# Patient Record
Sex: Male | Born: 1955 | Race: Black or African American | Hispanic: No | Marital: Married | Smoking: Current every day smoker
Health system: Southern US, Community
[De-identification: ages and names within clinical notes are randomized; demographics above are authoritative.]

## PROBLEM LIST (undated history)

## (undated) DIAGNOSIS — I10 Essential (primary) hypertension: Secondary | ICD-10-CM

## (undated) DIAGNOSIS — E119 Type 2 diabetes mellitus without complications: Secondary | ICD-10-CM

## (undated) DIAGNOSIS — Z960 Presence of urogenital implants: Secondary | ICD-10-CM

## (undated) DIAGNOSIS — G629 Polyneuropathy, unspecified: Secondary | ICD-10-CM

## (undated) DIAGNOSIS — I639 Cerebral infarction, unspecified: Secondary | ICD-10-CM

## (undated) DIAGNOSIS — Z978 Presence of other specified devices: Secondary | ICD-10-CM

## (undated) DIAGNOSIS — N400 Enlarged prostate without lower urinary tract symptoms: Secondary | ICD-10-CM

## (undated) DIAGNOSIS — E785 Hyperlipidemia, unspecified: Secondary | ICD-10-CM

## (undated) DIAGNOSIS — H409 Unspecified glaucoma: Secondary | ICD-10-CM

## (undated) HISTORY — PX: EYE SURGERY: SHX253

## (undated) HISTORY — PX: APPENDECTOMY: SHX54

---

## 2014-01-25 DIAGNOSIS — I639 Cerebral infarction, unspecified: Secondary | ICD-10-CM

## 2014-01-25 HISTORY — DX: Cerebral infarction, unspecified: I63.9

## 2016-12-02 ENCOUNTER — Encounter (HOSPITAL_COMMUNITY): Payer: Self-pay

## 2016-12-02 ENCOUNTER — Emergency Department (HOSPITAL_COMMUNITY)
Admission: EM | Admit: 2016-12-02 | Discharge: 2016-12-03 | Disposition: A | Discharge status: Home / Self Care | Payer: Medicare Other | Attending: Emergency Medicine | Admitting: Emergency Medicine

## 2016-12-02 DIAGNOSIS — Z7984 Long term (current) use of oral hypoglycemic drugs: Secondary | ICD-10-CM | POA: Diagnosis not present

## 2016-12-02 DIAGNOSIS — F1019 Alcohol abuse with unspecified alcohol-induced disorder: Secondary | ICD-10-CM | POA: Diagnosis not present

## 2016-12-02 DIAGNOSIS — Z7982 Long term (current) use of aspirin: Secondary | ICD-10-CM | POA: Diagnosis not present

## 2016-12-02 DIAGNOSIS — E11649 Type 2 diabetes mellitus with hypoglycemia without coma: Secondary | ICD-10-CM

## 2016-12-02 DIAGNOSIS — Z79899 Other long term (current) drug therapy: Secondary | ICD-10-CM | POA: Insufficient documentation

## 2016-12-02 DIAGNOSIS — R440 Auditory hallucinations: Secondary | ICD-10-CM | POA: Diagnosis not present

## 2016-12-02 DIAGNOSIS — F17210 Nicotine dependence, cigarettes, uncomplicated: Secondary | ICD-10-CM | POA: Insufficient documentation

## 2016-12-02 DIAGNOSIS — E1165 Type 2 diabetes mellitus with hyperglycemia: Secondary | ICD-10-CM | POA: Insufficient documentation

## 2016-12-02 DIAGNOSIS — R45851 Suicidal ideations: Secondary | ICD-10-CM | POA: Insufficient documentation

## 2016-12-02 DIAGNOSIS — I10 Essential (primary) hypertension: Secondary | ICD-10-CM | POA: Insufficient documentation

## 2016-12-02 DIAGNOSIS — F329 Major depressive disorder, single episode, unspecified: Secondary | ICD-10-CM | POA: Insufficient documentation

## 2016-12-02 DIAGNOSIS — Z720 Tobacco use: Secondary | ICD-10-CM

## 2016-12-02 DIAGNOSIS — Z008 Encounter for other general examination: Secondary | ICD-10-CM

## 2016-12-02 DIAGNOSIS — F1414 Cocaine abuse with cocaine-induced mood disorder: Secondary | ICD-10-CM | POA: Diagnosis present

## 2016-12-02 DIAGNOSIS — F1410 Cocaine abuse, uncomplicated: Secondary | ICD-10-CM

## 2016-12-02 DIAGNOSIS — F1010 Alcohol abuse, uncomplicated: Secondary | ICD-10-CM | POA: Diagnosis not present

## 2016-12-02 DIAGNOSIS — F1419 Cocaine abuse with unspecified cocaine-induced disorder: Secondary | ICD-10-CM | POA: Diagnosis not present

## 2016-12-02 HISTORY — DX: Unspecified glaucoma: H40.9

## 2016-12-02 HISTORY — DX: Essential (primary) hypertension: I10

## 2016-12-02 HISTORY — DX: Benign prostatic hyperplasia without lower urinary tract symptoms: N40.0

## 2016-12-02 HISTORY — DX: Type 2 diabetes mellitus without complications: E11.9

## 2016-12-02 HISTORY — DX: Hyperlipidemia, unspecified: E78.5

## 2016-12-02 HISTORY — DX: Cerebral infarction, unspecified: I63.9

## 2016-12-02 HISTORY — DX: Polyneuropathy, unspecified: G62.9

## 2016-12-02 LAB — CBG MONITORING, ED
Glucose-Capillary: 47 mg/dL — ABNORMAL LOW (ref 65–99)
Glucose-Capillary: 146 mg/dL — ABNORMAL HIGH (ref 65–99)
Glucose-Capillary: 108 mg/dL — ABNORMAL HIGH (ref 65–99)

## 2016-12-02 LAB — RAPID URINE DRUG SCREEN, HOSP PERFORMED
AMPHETAMINES: NOT DETECTED
Barbiturates: NOT DETECTED
Benzodiazepines: NOT DETECTED
COCAINE: POSITIVE — AB
OPIATES: NOT DETECTED
TETRAHYDROCANNABINOL: NOT DETECTED

## 2016-12-02 LAB — COMPREHENSIVE METABOLIC PANEL
ALT: 17 U/L (ref 17–63)
ANION GAP: 12 (ref 5–15)
AST: 16 U/L (ref 15–41)
Albumin: 4 g/dL (ref 3.5–5.0)
Alkaline Phosphatase: 59 U/L (ref 38–126)
BUN: 9 mg/dL (ref 6–20)
CHLORIDE: 102 mmol/L (ref 101–111)
CO2: 23 mmol/L (ref 22–32)
Calcium: 9.1 mg/dL (ref 8.9–10.3)
Creatinine, Ser: 1.29 mg/dL — ABNORMAL HIGH (ref 0.61–1.24)
GFR calc non Af Amer: 57 mL/min — ABNORMAL LOW (ref >60)
Glucose, Bld: 59 mg/dL — ABNORMAL LOW (ref 65–99)
POTASSIUM: 3.6 mmol/L (ref 3.5–5.1)
SODIUM: 137 mmol/L (ref 135–145)
Total Bilirubin: 0.6 mg/dL (ref 0.3–1.2)
Total Protein: 7.8 g/dL (ref 6.5–8.1)

## 2016-12-02 LAB — CBC
HCT: 43.1 % (ref 39.0–52.0)
HEMOGLOBIN: 14.6 g/dL (ref 13.0–17.0)
MCH: 30.2 pg (ref 26.0–34.0)
MCHC: 33.9 g/dL (ref 30.0–36.0)
MCV: 89.2 fL (ref 78.0–100.0)
Platelets: 371 10*3/uL (ref 150–400)
RBC: 4.83 MIL/uL (ref 4.22–5.81)
RDW: 14.6 % (ref 11.5–15.5)
WBC: 10.7 10*3/uL — AB (ref 4.0–10.5)

## 2016-12-02 LAB — SALICYLATE LEVEL

## 2016-12-02 LAB — ETHANOL: Alcohol, Ethyl (B): 34 mg/dL — ABNORMAL HIGH (ref <10)

## 2016-12-02 LAB — ACETAMINOPHEN LEVEL

## 2016-12-02 MED ORDER — VITAMIN B-1 100 MG PO TABS
100.0000 mg | ORAL_TABLET | Freq: Every day | ORAL | Status: DC
Start: 2016-12-02 — End: 2016-12-03
  Administered 2016-12-02 – 2016-12-03 (×2): 100 mg via ORAL
  Filled 2016-12-02 (×2): qty 1

## 2016-12-02 MED ORDER — LORAZEPAM 1 MG PO TABS
0.0000 mg | ORAL_TABLET | Freq: Four times a day (QID) | ORAL | Status: DC
  Administered 2016-12-03: 1 mg via ORAL
  Filled 2016-12-02: qty 1

## 2016-12-02 MED ORDER — ALUM & MAG HYDROXIDE-SIMETH 200-200-20 MG/5ML PO SUSP: 30.0000 mL | Freq: Four times a day (QID) | ORAL | Status: DC | PRN

## 2016-12-02 MED ORDER — ASPIRIN EC 81 MG PO TBEC
81.0000 mg | DELAYED_RELEASE_TABLET | Freq: Every day | ORAL | Status: DC
  Administered 2016-12-02 – 2016-12-03 (×2): 81 mg via ORAL
  Filled 2016-12-02 (×2): qty 1

## 2016-12-02 MED ORDER — LORAZEPAM 1 MG PO TABS: 0.0000 mg | ORAL_TABLET | Freq: Two times a day (BID) | ORAL | Status: DC

## 2016-12-02 MED ORDER — SIMVASTATIN 40 MG PO TABS
40.0000 mg | ORAL_TABLET | Freq: Every day | ORAL | Status: DC
  Administered 2016-12-02: 40 mg via ORAL
  Filled 2016-12-02: qty 1

## 2016-12-02 MED ORDER — ZOLPIDEM TARTRATE 5 MG PO TABS
5.0000 mg | ORAL_TABLET | Freq: Every evening | ORAL | Status: DC | PRN
Start: 2016-12-02 — End: 2016-12-03
  Administered 2016-12-02: 5 mg via ORAL
  Filled 2016-12-02: qty 1

## 2016-12-02 MED ORDER — LORAZEPAM 2 MG/ML IJ SOLN: 0.0000 mg | Freq: Four times a day (QID) | INTRAMUSCULAR | Status: DC

## 2016-12-02 MED ORDER — BISOPROLOL-HYDROCHLOROTHIAZIDE 5-6.25 MG PO TABS
1.0000 | ORAL_TABLET | Freq: Every day | ORAL | Status: DC
  Administered 2016-12-03: 1 via ORAL
  Filled 2016-12-02: qty 1

## 2016-12-02 MED ORDER — ACETAMINOPHEN 325 MG PO TABS: 650.0000 mg | ORAL_TABLET | ORAL | Status: DC | PRN

## 2016-12-02 MED ORDER — TAMSULOSIN HCL 0.4 MG PO CAPS
0.4000 mg | ORAL_CAPSULE | Freq: Two times a day (BID) | ORAL | Status: DC
  Administered 2016-12-02 – 2016-12-03 (×2): 0.4 mg via ORAL
  Filled 2016-12-02 (×2): qty 1

## 2016-12-02 MED ORDER — GABAPENTIN 300 MG PO CAPS
300.0000 mg | ORAL_CAPSULE | Freq: Two times a day (BID) | ORAL | Status: DC
  Administered 2016-12-02 – 2016-12-03 (×2): 300 mg via ORAL
  Filled 2016-12-02 (×2): qty 1

## 2016-12-02 MED ORDER — GLIMEPIRIDE 4 MG PO TABS
4.0000 mg | ORAL_TABLET | Freq: Every day | ORAL | Status: DC
  Administered 2016-12-03: 4 mg via ORAL
  Filled 2016-12-02: qty 1

## 2016-12-02 MED ORDER — TRAVOPROST 0.004 % OP SOLN: 1.0000 [drp] | Freq: Every day | OPHTHALMIC | Status: DC

## 2016-12-02 MED ORDER — LATANOPROST 0.005 % OP SOLN
1.0000 [drp] | Freq: Every day | OPHTHALMIC | Status: DC
  Administered 2016-12-02: 1 [drp] via OPHTHALMIC
  Filled 2016-12-02: qty 2.5

## 2016-12-02 MED ORDER — LORAZEPAM 2 MG/ML IJ SOLN: 0.0000 mg | Freq: Two times a day (BID) | INTRAMUSCULAR | Status: DC

## 2016-12-02 MED ORDER — THIAMINE HCL 100 MG/ML IJ SOLN: 100.0000 mg | Freq: Every day | INTRAMUSCULAR | Status: DC

## 2016-12-02 MED ORDER — ONDANSETRON HCL 4 MG PO TABS: 4.0000 mg | ORAL_TABLET | Freq: Three times a day (TID) | ORAL | Status: DC | PRN

## 2016-12-02 MED ORDER — NICOTINE 21 MG/24HR TD PT24
21.0000 mg | MEDICATED_PATCH | Freq: Every day | TRANSDERMAL | Status: DC
  Administered 2016-12-02 – 2016-12-03 (×2): 21 mg via TRANSDERMAL
  Filled 2016-12-02 (×2): qty 1

## 2016-12-02 NOTE — ED Notes (Signed)
Pt oriented to room and unit.  Pt is pleasant and cooperative.  Pt denies S/I,H/I, and AVH.  All belongings were locked in hall closet.  Two suitcases and one belongings bag.  15 minute checks and video monitoring in place.

## 2016-12-02 NOTE — ED Notes (Signed)
Bed: [facility] Expected date:  Expected time:  Means of arrival:  Comments: 

## 2016-12-02 NOTE — ED Triage Notes (Signed)
Patient brought in by EMS from hotel with complaints of depression for 6 months and cocaine dependency for 20 years. Requesting detox. Hx of htn and diabetes.

## 2016-12-02 NOTE — Progress Notes (Signed)
TTS spoke with Angelica ChessmanMandy, RN who states the pt is currently being assessed by peer support specialists in the conference room. [facility] once peer support has completed their assessment and TTS assessment will be completed once pt is in [facility].  Princess BruinsAquicha Tamura Lasky, MSW, LCSW Therapeutic Triage Specialist  [phone]

## 2016-12-02 NOTE — Patient Outreach (Signed)
ED Peer Support Specialist Patient Intake (Complete at intake & 30-60 Day Follow-up)  Name: Paul QuanWill Chase  MRN: [id number]  Age: 61 y.o.   Date of Admission: 12/02/2016  Intake: Initial Comments:      Primary Reason Admitted: Patient states "this morning, I woke up and I just feel like I have no purpose. Why am I even alive?" Patient reports having suicidal ideation without plan this morning, but denies homicidal ideation. Patient reports feelings of anxiety, depression, and frustration. Patient states "if I can just detox from the dope, I think I can get my life back together    Lab values: Alcohol/ETOH: Negative Positive UDS?   Amphetamines: No Barbiturates: No Benzodiazepines: No Cocaine: Yes Opiates: No Cannabinoids: Yes  Demographic information: Gender: Male Ethnicity: African American Marital Status: Married Insurance Status: Medicaid(united Care ) Control and instrumentation engineereceives non-medical governmental assistance (Work Engineer, agriculturalirst/Welfare, Sales executivefood stamps, etc.: No Lives with: Partner/Spouse Living situation: House/Apartment  Reported Patient History: Patient reported health conditions: Depression, Diabetes Patient aware of HIV and hepatitis status: No  In past year, has patient visited ED for any reason? No  Number of ED visits:    Reason(s) for visit:    In past year, has patient been hospitalized for any reason? No  Number of hospitalizations:    Reason(s) for hospitalization:    In past year, has patient been arrested? No  Number of arrests:    Reason(s) for arrest:    In past year, has patient been incarcerated?    Number of incarcerations:    Reason(s) for incarceration:    In past year, has patient received medication-assisted treatment? No  In past year, patient received the following treatments:    In past year, has patient received any harm reduction services? No  Did this include any of the following?    In past year, has patient received care from a mental health provider  for diagnosis other than SUD? No  In past year, is this first time patient has overdosed? No  Number of past overdoses:    In past year, is this first time patient has been hospitalized for an overdose? No  Number of hospitalizations for overdose(s):    Is patient currently receiving treatment for a mental health diagnosis? No  Patient reports experiencing difficulty participating in SUD treatment: No    Most important reason(s) for this difficulty?    Has patient received prior services for treatment? No  In past, patient has received services from following agencies:    Plan of Care:  Suggested follow up at these agencies/treatment centers: [facility] (Substance Abuse Intensive Outpatient Program), [facility] (Alcohol/Drugs Services)  Other information: CPSS Arlys JohnBrian and [name]  asked Pt several questions and was made aware that he does not want use any longer. CPSS addressed a few facilities that Paul help him. CPSS made Pt aware of [facility].   Arlys JohnBrian [name], CPSS  12/02/2016 5:31 PM

## 2016-12-02 NOTE — BH Assessment (Signed)
[facility] Assessment Progress Note   Pt is still in hallway. TTS spoke with Angelica ChessmanMandy, RN who states [facility] once [facility] has given his medication. Chart states pt is in [facility] however [facility]  Princess BruinsAquicha Jacquel Mccamish, MSW, LCSW Therapeutic Triage Specialist  [phone]

## 2016-12-02 NOTE — ED Notes (Signed)
Patient provided with orange juice  

## 2016-12-02 NOTE — ED Notes (Signed)
Patient provided with Malawiturkey sandwich and water, per order

## 2016-12-02 NOTE — ED Triage Notes (Signed)
Patient reports he has been "smoking dope" for 20 years. Patient [place] "to stop taking the drugs" but patient reports "the motel Im staying at has strangers that are offering me cocaine and I just cant refuse." Patient states "this morning, I woke up and I just feel like I have no purpose. Why am I even alive?" Patient reports having suicidal ideation without plan this morning, but denies homicidal ideation. Patient reports feelings of anxiety, depression, and frustration. Patient states "if I can just detox from the dope, I think I can get my life back together."

## 2016-12-02 NOTE — BH Assessment (Addendum)
Assessment Note  Paul Chase is an 61 y.o. male, who [facility]. Pt reported, a month ago he moved from [place], [place] for a fresh start and to change his environment. Pt reported, he initially moved to the town where his daughter resides however learned the town was drug infested. Pt reported, [place]. Pt reported, the motel he and his wife currently resides in is in a drug infested. Pt reported, he continued to use. Pt reported, he woke up this morning feeling down. Pt reported, thinking about how he messed his life up and al the people he has disappointed. Pt reported, "I had not reason to be on this Earth, what am I here for, I just want to end it all." Pt denies, HI, AVH, self-injurious behaviors and access to weapons.   Pt denies abuse. Pt reported, smoking crack and a joint, on Tuesday (11/30/2016.) Pt reported, drinking a can of beer. Pt's BAL was 34 at 1434. Pt's UDS is positive for cocaine. Pt denies, being linked to OPT resources (medication management and/or counseling.) Pt denies, previous inpatient admissions.   Pt presents alert in scrubs with logical/coherent speech. Pt's eye contact was good. Pt's mood was depressed. Pt's affect was congruent with mood. Pt's thought process was coherent/relevant. Pt's judgement was unpaired. Pt's concentration was normal. Pt's insight was fair. Pt's impulse control was poor. Pt was oriented x4 (day, year, city and state.) Pt reported, [facility] he could not contract for safety. Pt reported, if inpatient treatment was recommended he would sign-in voluntarily.   Diagnosis: F32.2 Major depressive disorder, Single episode, Severe without Psychotic Features.                     F14.20 Cocaine use disorder, Severe.                    F12.20 Cannabis use disorder, Moderate.  Past Medical History:  Past Medical History:  Diagnosis Date  . BPH (benign prostatic hyperplasia)   .  Diabetes mellitus without complication (HCC)    type 2  . Glaucoma   . Hyperlipidemia   . Hypertension   . Neuropathy   . Stroke [facility](HCC) 2016    History reviewed. No pertinent surgical history.  Family History: No family history on file.  Social History:  reports that he has been smoking cigarettes.  He has been smoking about 1.00 pack per day. He does not have any smokeless tobacco history on file. He reports that he drinks alcohol. He reports that he uses drugs. Drug: Cocaine.  Additional Social History:  Alcohol / Drug Use Pain Medications: See MAR Prescriptions: See MAR Over the Counter: See MAR History of alcohol / drug use?: Yes Substance #1 Name of Substance 1: Crack Cocaine. 1 - Age of First Use: UTA 1 - Amount (size/oz): Pt reported, smoking a gram of crack cocaine, Tuesday.  1 - Frequency: UTA 1 - Duration: Ongoing. 1 - Last Use / Amount: Pt reported, Tuesday (11/30/2016). Substance #2 Name of Substance 2: Alochol 2 - Age of First Use: UTA 2 - Amount (size/oz): Pt reportedm drinking a can of beer, today. Pt's BAL is 34 at 1434.  2 - Frequency: UTA 2 - Duration: UTA 2 - Last Use / Amount: Pt reported, today.  Substance #3 Name of Substance 3: Marijuana. 3 - Age of First Use: UTA 3 - Amount (size/oz): Pt reported, smoking a joint,  Tuesday (11/30/2016). 3 - Frequency: UTA 3 - Duration: Ongoing, 3 - Last Use / Amount: Pt reported, Tuesday (11/30/2016).  CIWA: CIWA-Ar BP: (!) 144/77 Pulse Rate: 71 Nausea and Vomiting: no nausea and no vomiting Tactile Disturbances: none Tremor: no tremor Auditory Disturbances: not present Paroxysmal Sweats: no sweat visible Visual Disturbances: not present Anxiety: mildly anxious Headache, Fullness in Head: very mild Agitation: normal activity Orientation and Clouding of Sensorium: oriented and can do serial additions CIWA-Ar Total: 2 COWS:    Allergies: No Known Allergies  Home Medications:  (Not in a hospital  admission)  OB/GYN Status:  No LMP for male patient.  General Assessment Data Assessment unable to be completed: Yes Reason for not completing assessment: TTS spoke with Angelica ChessmanMandy, RN who states the pt is currently being assessed by peer support specialists in the conference room. [facility] once peer support has completed their assessment and TTS assessment Paul be completed once pt is in [facility]. [facility]: [facility] TTS Assessment: In system Is this a Tele or Face-to-Face Assessment?: Face-to-Face Is this an Initial Assessment or a Re-assessment for this encounter?: Initial Assessment Marital status: Married Is patient pregnant?: No Pregnancy Status: No Living Arrangements: Spouse/significant other Can pt return to current living arrangement?: Yes Admission Status: Voluntary Is patient capable of signing voluntary admission?: Yes Referral Source: Self/Family/Friend Insurance type: Self-pay     Crisis Care Plan Living Arrangements: Spouse/significant other Legal Guardian: Other:(Self) Name of Psychiatrist: NA Name of Therapist: NA  Education Status Is patient currently in school?: No Current Grade: NA Highest grade of school patient has completed: 12th grade. Name of school: NA Contact person: NA  Risk to self with the past 6 months Suicidal Ideation: No-Not Currently/Within Last 6 Months Has patient been a risk to self within the past 6 months prior to admission? : No Suicidal Intent: No Has patient had any suicidal intent within the past 6 months prior to admission? : No Is patient at risk for suicide?: Yes Suicidal Plan?: No Has patient had any suicidal plan within the past 6 months prior to admission? : No Access to Means: No What has been your use of drugs/alcohol within the last 12 months?: Alochol and crack cocaine.  Previous Attempts/Gestures: No How many times?: 0 Other Self Harm Risks: Pt denies.  Triggers for Past Attempts:  None known Intentional Self Injurious Behavior: None(Pt denies. ) Family Suicide History: No Recent stressful life event(s): Other (Comment)(Pt reported, wanting to get off of drugs.) Persecutory voices/beliefs?: No Depression: Yes Depression Symptoms: Feeling angry/irritable, Feeling worthless/self pity, Guilt, Fatigue, Isolating, Loss of interest in usual pleasures Substance abuse history and/or treatment for substance abuse?: Yes Suicide prevention information given to non-admitted patients: Not applicable  Risk to Others within the past 6 months Homicidal Ideation: No-Not Currently/Within Last 6 Months Does patient have any lifetime risk of violence toward others beyond the six months prior to admission? : No(Pt denies.) Thoughts of Harm to Others: No-Not Currently Present/Within Last 6 Months Current Homicidal Intent: No Current Homicidal Plan: No Access to Homicidal Means: No(Pt denies. ) Identified Victim: Pt reported, if someone mess with him.  History of harm to others?: No Assessment of Violence: None Noted Violent Behavior Description: NA Does patient have access to weapons?: No(Pt denies. ) Criminal Charges Pending?: No Does patient have a court date: No Is patient on probation?: No  Psychosis Hallucinations: None noted Delusions: None noted  Mental Status Report Appearance/Hygiene: In scrubs  Eye Contact: Good Motor Activity: Unremarkable Speech: Logical/coherent Level of Consciousness: Alert Mood: Depressed Affect: Other (Comment)(congruent with mood. ) Anxiety Level: Minimal Thought Processes: Coherent, Relevant Judgement: Unimpaired Orientation: Other (Comment)(day, year, city and state. ) Obsessive Compulsive Thoughts/Behaviors: None  Cognitive Functioning Concentration: Normal Memory: Recent Intact IQ: Average Insight: Fair Impulse Control: Poor Appetite: Poor Sleep: Decreased Total Hours of Sleep: (Pt reported, not sleeping in two days.  )  ADLScreening [facility] Assessment Services) Patient's cognitive ability adequate to safely complete daily activities?: Yes Patient able to express need for assistance with ADLs?: Yes Independently performs ADLs?: Yes (appropriate for developmental age)  Prior Inpatient Therapy Prior Inpatient Therapy: No Prior Therapy Dates: NA Prior Therapy Facilty/Provider(s): NA Reason for Treatment: NA  Prior Outpatient Therapy Prior Outpatient Therapy: No Prior Therapy Dates: NA Prior Therapy Facilty/Provider(s): NA Reason for Treatment: NA Does patient have an ACCT team?: No Does patient have Intensive In-House Services?  : No Does patient have [facility] services? : No Does patient have P4CC services?: No  ADL Screening (condition at time of admission) Patient's cognitive ability adequate to safely complete daily activities?: Yes Is the patient deaf or have difficulty hearing?: No Does the patient have difficulty seeing, even when wearing glasses/contacts?: Yes Does the patient have difficulty concentrating, remembering, or making decisions?: Yes Patient able to express need for assistance with ADLs?: Yes Does the patient have difficulty dressing or bathing?: No Independently performs ADLs?: Yes (appropriate for developmental age) Does the patient have difficulty walking or climbing stairs?: No Weakness of Legs: Left(Pt reported, left hip pain. ) Weakness of Arms/Hands: None  Home Assistive Devices/Equipment Home Assistive Devices/Equipment: None    Abuse/Neglect Assessment (Assessment to be complete while patient is alone) Abuse/Neglect Assessment Can Be Completed: (Pt denies. )     Advance Directives (For Healthcare) Does Patient Have a Medical Advance Directive?: (UTA)    Additional Information 1:1 In Past 12 Months?: No CIRT Risk: No Elopement Risk: No Does patient have medical clearance?: Yes     Disposition: Nira Conn, NP recommends observation for safety and  stabilization, pt unable to contact for safety, possible secondary gains. Disposition discussed with France Ravens, PA and Lowella Bandy, Charity fundraiser.   Disposition Initial Assessment Completed for this Encounter: Yes Disposition of Patient: (recommends observation for safety and stabilization.)  On Site Evaluation by:   Reviewed with Physician:  France Ravens, PA and Nira Conn, NP.  Redmond Pulling 12/02/2016 9:38 PM    Redmond Pulling, MS, [facility], [facility] Triage Specialist [phone]

## 2016-12-02 NOTE — ED Provider Notes (Signed)
[facility]-EMERGENCY DEPT Provider Note   CSN: [id number] Arrival date & time: 12/02/16  1340     History   Chief Complaint Chief Complaint  Patient presents with  . Depression    Detox    HPI Paul Chase is a 61 y.o. male with a PMHx of HTN, DM2, HLD, glaucoma, BPH, peripheral neuropathy, and remote CVA 4931yrs ago, who presents to the ED with complaints of gradually worsening depression over the last 6 months, with onset of suicidal ideations this morning without a plan.  Patient states that he moved here from [place] in order to get away from the environment he was in where he was using drugs, has been a cocaine user for 20 years.  After moving here, he ended up getting back into drugs, and has continued to use cocaine.  He is frustrated with his cocaine addiction, and this has caused him anxiety and depression and led to his suicidal ideations.  He last used cocaine last night.  He also admits to being an alcohol drinker, has approximately 1-2 20oz beers daily, and had a [facility] mixed drink this morning PTA. +Tobacco user. Denies other illicit drug use, AVH, or HI. Denies any medical complaints at this time. Has never seen a psychiatrist/therapist, and is not on any psychiatric medications. He is here voluntarily. Of note, he hasn't eaten anything all day, and he did take his medications this morning including his diabetes meds.    The history is provided by the patient and medical records. No language interpreter was used.  Mental Health Problem  Presenting symptoms: depression and suicidal thoughts   Presenting symptoms: no hallucinations and no homicidal ideas   Onset quality:  Gradual Duration:  6 months Timing:  Constant Progression:  Worsening Chronicity:  New Context: alcohol use and drug abuse   Treatment compliance:  Untreated Relieved by:  None tried Worsened by:  Alcohol and drugs Ineffective treatments:  None tried Associated symptoms: anxiety    Associated symptoms: no abdominal pain and no chest pain   Risk factors: no hx of mental illness and no recent psychiatric admission     Past Medical History:  Diagnosis Date  . BPH (benign prostatic hyperplasia)   . Diabetes mellitus without complication (HCC)    type 2  . Glaucoma   . Hyperlipidemia   . Hypertension   . Neuropathy   . Stroke [facility](HCC) 2016    There are no active problems to display for this patient.   History reviewed. No pertinent surgical history.     Home Medications    Prior to Admission medications   Medication Sig Start Date End Date Taking? Authorizing Provider  aspirin EC 81 MG tablet Take 81 mg daily by mouth.   Yes [provider]  bisoprolol-hydrochlorothiazide (ZIAC) 5-6.25 MG tablet Take 1 tablet daily by mouth.   Yes [provider]  gabapentin (NEURONTIN) 300 MG capsule Take 300 mg 2 (two) times daily by mouth.   Yes [provider]  glimepiride (AMARYL) 4 MG tablet Take 4 mg daily with breakfast by mouth.   Yes [provider]  simvastatin (ZOCOR) 40 MG tablet Take 40 mg at bedtime by mouth.   Yes [provider]  tamsulosin (FLOMAX) 0.4 MG CAPS capsule Take 0.4 mg 2 (two) times daily by mouth.   Yes [provider]  travoprost, benzalkonium, (TRAVATAN) 0.004 % ophthalmic solution Place 1 drop at bedtime into both eyes.   Yes [provider]  Family History No family history on file.  Social History Social History   Tobacco Use  . Smoking status: Current Every Day Smoker    Packs/day: 1.00    Types: Cigarettes  Substance Use Topics  . Alcohol use: Yes  . Drug use: Yes    Types: Cocaine     Allergies   Patient has no known allergies.   Review of Systems Review of Systems  Constitutional: Negative for chills and fever.  Respiratory: Negative for shortness of breath.   Cardiovascular: Negative for chest pain.  Gastrointestinal: Negative for abdominal pain,  constipation, diarrhea, nausea and vomiting.  Genitourinary: Negative for dysuria and hematuria.  Musculoskeletal: Negative for arthralgias and myalgias.  Skin: Negative for color change.  Allergic/Immunologic: Positive for immunocompromised state (DM2).  Neurological: Negative for weakness and numbness.  Psychiatric/Behavioral: Positive for depression and suicidal ideas. Negative for confusion, hallucinations and homicidal ideas. The patient is nervous/anxious.    All other systems reviewed and are negative for acute change except as noted in the HPI.    Physical Exam Updated Vital Signs BP 114/80   Pulse 74   Temp 98.7 F (37.1 C) (Oral)   Resp 15   SpO2 100%   Physical Exam  Constitutional: He is oriented to person, place, and time. Vital signs are normal. He appears well-developed and well-nourished.  Non-toxic appearance. No distress.  Afebrile, nontoxic, NAD  HENT:  Head: Normocephalic and atraumatic.  Mouth/Throat: Oropharynx is clear and moist and mucous membranes are normal.  Eyes: Conjunctivae and EOM are normal. Right eye exhibits no discharge. Left eye exhibits no discharge.  Neck: Normal range of motion. Neck supple.  Cardiovascular: Normal rate, regular rhythm, normal heart sounds and intact distal pulses. Exam reveals no gallop and no friction rub.  No murmur heard. Pulmonary/Chest: Effort normal and breath sounds normal. No respiratory distress. He has no decreased breath sounds. He has no wheezes. He has no rhonchi. He has no rales.  Abdominal: Soft. Normal appearance and bowel sounds are normal. He exhibits no distension. There is no tenderness. There is no rigidity, no rebound, no guarding, no CVA tenderness, no tenderness at McBurney's point and negative Murphy's sign.  Musculoskeletal: Normal range of motion.  Neurological: He is alert and oriented to person, place, and time. He has normal strength. No sensory deficit.  Skin: Skin is warm, dry and intact. No rash  noted.  Psychiatric: He is not actively hallucinating. He exhibits a depressed mood. He expresses suicidal ideation. He expresses no homicidal ideation. He expresses no suicidal plans and no homicidal plans.  Depressed affect, but pleasant and cooperative. Endorsing SI without a plan, denies HI or AVH, doesn't seem to be responding to internal stimuli.   Nursing note and vitals reviewed.    ED Treatments / Results  Labs (all labs ordered are listed, but only abnormal results are displayed) Labs Reviewed  COMPREHENSIVE METABOLIC PANEL - Abnormal; Notable for the following components:      Result Value   Glucose, Bld 59 (*)    Creatinine, Ser 1.29 (*)    GFR calc non Af Amer 57 (*)    All other components within normal limits  ETHANOL - Abnormal; Notable for the following components:   Alcohol, Ethyl (B) 34 (*)    All other components within normal limits  ACETAMINOPHEN LEVEL - Abnormal; Notable for the following components:   Acetaminophen (Tylenol), Serum <10 (*)    All other components within normal limits  CBC - Abnormal;  Notable for the following components:   WBC 10.7 (*)    All other components within normal limits  RAPID URINE DRUG SCREEN, HOSP PERFORMED - Abnormal; Notable for the following components:   Cocaine POSITIVE (*)    All other components within normal limits  CBG MONITORING, ED - Abnormal; Notable for the following components:   Glucose-Capillary 47 (*)    All other components within normal limits  CBG MONITORING, ED - Abnormal; Notable for the following components:   Glucose-Capillary 146 (*)    All other components within normal limits  SALICYLATE LEVEL    EKG  EKG Interpretation None       Radiology No results found.  Procedures Procedures (including critical care time)  Medications Ordered in ED Medications  aspirin EC tablet 81 mg (not administered)  bisoprolol-hydrochlorothiazide (ZIAC) 5-6.25 MG per tablet 1 tablet (not administered)    gabapentin (NEURONTIN) capsule 300 mg (not administered)  glimepiride (AMARYL) tablet 4 mg (not administered)  simvastatin (ZOCOR) tablet 40 mg (not administered)  tamsulosin (FLOMAX) capsule 0.4 mg (not administered)  travoprost (benzalkonium) (TRAVATAN) 0.004 % ophthalmic solution 1 drop (not administered)  LORazepam (ATIVAN) injection 0-4 mg (not administered)    Or  LORazepam (ATIVAN) tablet 0-4 mg (not administered)  LORazepam (ATIVAN) injection 0-4 mg (not administered)    Or  LORazepam (ATIVAN) tablet 0-4 mg (not administered)  thiamine (VITAMIN B-1) tablet 100 mg (not administered)    Or  thiamine (B-1) injection 100 mg (not administered)  acetaminophen (TYLENOL) tablet 650 mg (not administered)  zolpidem (AMBIEN) tablet 5 mg (not administered)  ondansetron (ZOFRAN) tablet 4 mg (not administered)  alum & mag hydroxide-simeth (MAALOX/MYLANTA) 200-200-20 MG/5ML suspension 30 mL (not administered)  nicotine (NICODERM CQ - dosed in mg/24 hours) patch 21 mg (not administered)     Initial Impression / Assessment and Plan / ED Course  I have reviewed the triage vital signs and the nursing notes.  Pertinent labs & imaging results that were available during my care of the patient were reviewed by me and considered in my medical decision making (see chart for details).     61 y.o. male here with depression x6 months which has worsened today and now having SI without a plan; reports anxiety and frustration due to his cocaine abuse, moved away from [place] because he wanted to get away from it but ended up getting back into it. Denies HI/AVH, other illicit drug use, but admits to drinking EtOH regularly. +Tobacco user, cessation advised. Denies medical complaints at this time. Here voluntarily. Paul get labs and reassess shortly.   3:44 PM CBC essentially unremarkable. CMP with marginally elevated Cr 1.29 (unknown baseline) likely from dehydration; also Gluc 59, pt hasn't eaten today;  Paul give [place] sandwich and recheck CBG. EtOH level 34. Salicylate and acetaminophen levels WNL. UDS with +cocaine. Paul reassess after [place] sandwich and CBG recheck.  3:59 PM CBG 47 on recheck but was very quick after [place] sandwich; OJ given and Paul have recheck about later. Pt alert and oriented, and does not appear to be symptomatic from this hypoglycemia; it's likely from the lack of PO intake today and taking his DM2 meds this morning. Paul reassess shortly.   4:41 PM Repeat CBG 146 after OJ and [place] sandwich; I anticipate his sugars Paul remain stable now that he has had something to eat. Paul place order to check 4x/day in order to keep an eye on them, but otherwise pt medically cleared at this  time. Psych hold orders, CIWA orders, and home med orders placed. Please see TTS notes for further documentation of care/dispo. PLEASE NOTE THAT PT IS HERE VOLUNTARILY AT THIS TIME, IF PT TRIES TO LEAVE THEY WOULD NEED IVC PAPERWORK TAKEN OUT. Pt stable at time of med clearance.     Final Clinical Impressions(s) / ED Diagnoses   Final diagnoses:  Suicidal ideation  Medical clearance for psychiatric admission  Alcohol abuse  Cocaine abuse (HCC)  Tobacco user  Hypoglycemia due to type 2 diabetes mellitus [facility](HCC)    ED Discharge Orders    [address]None       Leisel Pinette, [place], [place]-C 12/02/16 1643    Nira Connardama, Pedro Eduardo, MD 12/07/16 1737

## 2016-12-02 NOTE — Progress Notes (Signed)
TTS unable to assess pt due to pt not being in room. TTS will provide report to PM counselor for assessment to be completed during the next shift.  Princess BruinsAquicha Zenith Kercheval, MSW, LCSW Therapeutic Triage Specialist  [phone]

## 2016-12-03 ENCOUNTER — Other Ambulatory Visit: Payer: Self-pay

## 2016-12-03 ENCOUNTER — Emergency Department (HOSPITAL_COMMUNITY)
Admission: EM | Admit: 2016-12-03 | Discharge: 2016-12-04 | Disposition: A | Discharge status: Other Acute Inpatient Hospital | Payer: Medicare Other | Attending: Emergency Medicine | Admitting: Emergency Medicine

## 2016-12-03 ENCOUNTER — Encounter (HOSPITAL_COMMUNITY): Payer: Self-pay

## 2016-12-03 DIAGNOSIS — I10 Essential (primary) hypertension: Secondary | ICD-10-CM | POA: Insufficient documentation

## 2016-12-03 DIAGNOSIS — R45851 Suicidal ideations: Secondary | ICD-10-CM | POA: Diagnosis not present

## 2016-12-03 DIAGNOSIS — Z7984 Long term (current) use of oral hypoglycemic drugs: Secondary | ICD-10-CM | POA: Diagnosis not present

## 2016-12-03 DIAGNOSIS — F1010 Alcohol abuse, uncomplicated: Secondary | ICD-10-CM

## 2016-12-03 DIAGNOSIS — E119 Type 2 diabetes mellitus without complications: Secondary | ICD-10-CM | POA: Insufficient documentation

## 2016-12-03 DIAGNOSIS — Z79899 Other long term (current) drug therapy: Secondary | ICD-10-CM | POA: Insufficient documentation

## 2016-12-03 DIAGNOSIS — Z046 Encounter for general psychiatric examination, requested by authority: Secondary | ICD-10-CM | POA: Insufficient documentation

## 2016-12-03 DIAGNOSIS — R440 Auditory hallucinations: Secondary | ICD-10-CM | POA: Diagnosis present

## 2016-12-03 DIAGNOSIS — Z7982 Long term (current) use of aspirin: Secondary | ICD-10-CM | POA: Diagnosis not present

## 2016-12-03 DIAGNOSIS — F329 Major depressive disorder, single episode, unspecified: Secondary | ICD-10-CM | POA: Insufficient documentation

## 2016-12-03 DIAGNOSIS — F17210 Nicotine dependence, cigarettes, uncomplicated: Secondary | ICD-10-CM | POA: Diagnosis not present

## 2016-12-03 DIAGNOSIS — F1414 Cocaine abuse with cocaine-induced mood disorder: Secondary | ICD-10-CM | POA: Diagnosis present

## 2016-12-03 LAB — CBC
HCT: 46.6 % (ref 39.0–52.0)
Hemoglobin: 15.6 g/dL (ref 13.0–17.0)
MCH: 30.4 pg (ref 26.0–34.0)
MCHC: 33.5 g/dL (ref 30.0–36.0)
MCV: 90.8 fL (ref 78.0–100.0)
PLATELETS: 325 10*3/uL (ref 150–400)
RBC: 5.13 MIL/uL (ref 4.22–5.81)
RDW: 14.7 % (ref 11.5–15.5)
WBC: 11.2 10*3/uL — ABNORMAL HIGH (ref 4.0–10.5)

## 2016-12-03 LAB — COMPREHENSIVE METABOLIC PANEL
ALK PHOS: 66 U/L (ref 38–126)
ALT: 18 U/L (ref 17–63)
AST: 17 U/L (ref 15–41)
Albumin: 4.3 g/dL (ref 3.5–5.0)
Anion gap: 10 (ref 5–15)
BILIRUBIN TOTAL: 0.5 mg/dL (ref 0.3–1.2)
BUN: 9 mg/dL (ref 6–20)
CALCIUM: 9.3 mg/dL (ref 8.9–10.3)
CO2: 26 mmol/L (ref 22–32)
CREATININE: 1.29 mg/dL — AB (ref 0.61–1.24)
Chloride: 101 mmol/L (ref 101–111)
GFR, EST NON AFRICAN AMERICAN: 57 mL/min — AB (ref >60)
Glucose, Bld: 86 mg/dL (ref 65–99)
Potassium: 3.5 mmol/L (ref 3.5–5.1)
Sodium: 137 mmol/L (ref 135–145)
TOTAL PROTEIN: 7.7 g/dL (ref 6.5–8.1)

## 2016-12-03 LAB — RAPID URINE DRUG SCREEN, HOSP PERFORMED
AMPHETAMINES: NOT DETECTED
Barbiturates: NOT DETECTED
Benzodiazepines: NOT DETECTED
Cocaine: POSITIVE — AB
OPIATES: NOT DETECTED
TETRAHYDROCANNABINOL: NOT DETECTED

## 2016-12-03 LAB — ETHANOL

## 2016-12-03 LAB — ACETAMINOPHEN LEVEL: Acetaminophen (Tylenol), Serum: <10 ug/mL — ABNORMAL LOW (ref 10–30)

## 2016-12-03 LAB — SALICYLATE LEVEL

## 2016-12-03 MED ORDER — NICOTINE 21 MG/24HR TD PT24
21.0000 mg | MEDICATED_PATCH | Freq: Every day | TRANSDERMAL | Status: DC
  Administered 2016-12-04: 21 mg via TRANSDERMAL
  Filled 2016-12-03: qty 1

## 2016-12-03 MED ORDER — SIMVASTATIN 40 MG PO TABS
40.0000 mg | ORAL_TABLET | Freq: Every day | ORAL | Status: DC
  Administered 2016-12-03: 40 mg via ORAL
  Filled 2016-12-03: qty 1

## 2016-12-03 MED ORDER — ASPIRIN EC 81 MG PO TBEC
81.0000 mg | DELAYED_RELEASE_TABLET | Freq: Every day | ORAL | Status: DC
  Administered 2016-12-04: 81 mg via ORAL
  Filled 2016-12-03: qty 1

## 2016-12-03 MED ORDER — GABAPENTIN 300 MG PO CAPS
300.0000 mg | ORAL_CAPSULE | Freq: Two times a day (BID) | ORAL | Status: DC
  Administered 2016-12-03 – 2016-12-04 (×2): 300 mg via ORAL
  Filled 2016-12-03 (×2): qty 1

## 2016-12-03 MED ORDER — GLIMEPIRIDE 4 MG PO TABS
4.0000 mg | ORAL_TABLET | Freq: Every day | ORAL | Status: DC
  Filled 2016-12-03: qty 1

## 2016-12-03 MED ORDER — BISOPROLOL-HYDROCHLOROTHIAZIDE 5-6.25 MG PO TABS
1.0000 | ORAL_TABLET | Freq: Every day | ORAL | Status: DC
  Administered 2016-12-04: 1 via ORAL
  Filled 2016-12-03: qty 1

## 2016-12-03 NOTE — BH Assessment (Addendum)
[facility]  Per Juanetta BeetsJacqueline Norman, DO, this pt does not require psychiatric hospitalization at this time.  [facility]  He does not want outpatient referral information, but would benefit from seeing Peer Support Specialists; they will be asked to speak to pt.  Pt's nurse, Diane, has been notified.  Doylene Canninghomas Rose-Marie Hickling, MA Triage Specialist [phone]   Addendum:  Per pt's request, referral information for area supportive services for the homeless has been added to pt's discharge instructions.  Diane has been notified.  Doylene Canninghomas Jasline Buskirk, MA Triage Specialist [phone]

## 2016-12-03 NOTE — BH Assessment (Signed)
[facility] Assessment Progress Note  Per Nira ConnJason Berry, NP pt is recommended for inpt treatment. TTS to seek placement. Pt's nurse Susy FrizzleMatt, RN and EDP Dr. Freida BusmanAllen, MD contacted and notified of disposition.   Princess BruinsAquicha Malisha Mabey, MSW, LCSW Therapeutic Triage Specialist  [phone]

## 2016-12-03 NOTE — ED Notes (Signed)
Patient appropriate on unit. States he feels better than he was in the morning. Compliant with med. Sleeping at this time. Will continue to monitor patient.

## 2016-12-03 NOTE — ED Triage Notes (Signed)
Pt states that he is SI due to recent problems with his wife. Pt is having thoughts of SI. No plan. Denies hallucinations. Pt has been "drinking as much as I can and doing crack cocaine as much as I can" VSS.

## 2016-12-03 NOTE — ED Provider Notes (Signed)
[facility] Provider [facility]   CSN: [id number] Arrival date & time: 12/03/16  1713     History   Chief Complaint Chief Complaint  Patient presents with  . Suicidal    HPI Paul Chase is a 61 y.o. male.  Patient with a history of DM, HTN, HLD, depression presents with worsening suicidal ideations. He was seen and evaluated last night for same and was discharged with outpatient resources. He returns tonight stating his SI is worse and he is hearing voices that tell him "go ahead and do it". He denies self harm. He also endorses feelings of wanting to hurt his brother in law "but I don't want to kill him".    The history is provided by the patient. No language interpreter was used.    Past Medical History:  Diagnosis Date  . BPH (benign prostatic hyperplasia)   . Diabetes mellitus without complication (HCC)    type 2  . Glaucoma   . Hyperlipidemia   . Hypertension   . Neuropathy   . Stroke [facility](HCC) 2016    Patient Active Problem List   Diagnosis Date Noted  . Cocaine abuse with cocaine-induced mood disorder (HCC) 12/03/2016  . Alcohol abuse     History reviewed. No pertinent surgical history.     Home Medications    Prior to Admission medications   Medication Sig Start Date End Date Taking? Authorizing Provider  aspirin EC 81 MG tablet Take 81 mg daily by mouth.    [provider]  bisoprolol-hydrochlorothiazide (ZIAC) 5-6.25 MG tablet Take 1 tablet daily by mouth.    [provider]  gabapentin (NEURONTIN) 300 MG capsule Take 300 mg 2 (two) times daily by mouth.    [provider]  glimepiride (AMARYL) 4 MG tablet Take 4 mg daily with breakfast by mouth.    [provider]  simvastatin (ZOCOR) 40 MG tablet Take 40 mg at bedtime by mouth.    [provider]  tamsulosin (FLOMAX) 0.4 MG CAPS capsule Take 0.4 mg 2 (two) times daily by mouth.    [provider]  travoprost,  benzalkonium, (TRAVATAN) 0.004 % ophthalmic solution Place 1 drop at bedtime into both eyes.    [provider]    Family History History reviewed. No pertinent family history.  Social History Social History   Tobacco Use  . Smoking status: Current Every Day Smoker    Packs/day: 1.00    Types: Cigarettes  Substance Use Topics  . Alcohol use: Yes    Comment: :"as much as I can"  . Drug use: Yes    Types: Cocaine    Comment: "as much as I can"     Allergies   Patient has no known allergies.   Review of Systems Review of Systems  Constitutional: Negative.   HENT: Negative.   Respiratory: Negative.   Cardiovascular: Negative.   Gastrointestinal: Negative.   Musculoskeletal: Negative.   Skin: Negative.   Neurological: Negative.   Psychiatric/Behavioral: Positive for hallucinations and suicidal ideas. Negative for self-injury.     Physical Exam Updated Vital Signs BP (!) 152/88 (BP Location: Left Arm)   Pulse 99   Temp 98.6 F (37 C) (Oral)   Resp 16   SpO2 99%   Physical Exam  Constitutional: He is oriented to person, place, and time. He appears well-developed and well-nourished.  HENT:  Head: Normocephalic.  Neck: Normal range of motion. Neck supple.  Cardiovascular: Normal rate and regular rhythm.  Pulmonary/Chest: Effort normal and breath sounds normal.  Abdominal: Soft. Bowel sounds are normal. There is no tenderness. There is no rebound and no guarding.  Musculoskeletal: Normal range of motion.  Neurological: He is alert and oriented to person, place, and time.  Skin: Skin is warm and dry.  Psychiatric: His speech is normal. He is actively hallucinating. He expresses suicidal ideation.     ED Treatments / Results  Labs (all labs ordered are listed, but only abnormal results are displayed) Labs Reviewed  COMPREHENSIVE METABOLIC PANEL - Abnormal; Notable for the following components:      Result Value   Creatinine, Ser 1.29 (*)    GFR calc  non Af Amer 57 (*)    All other components within normal limits  ACETAMINOPHEN LEVEL - Abnormal; Notable for the following components:   Acetaminophen (Tylenol), Serum <10 (*)    All other components within normal limits  CBC - Abnormal; Notable for the following components:   WBC 11.2 (*)    All other components within normal limits  RAPID URINE DRUG SCREEN, HOSP PERFORMED - Abnormal; Notable for the following components:   Cocaine POSITIVE (*)    All other components within normal limits  ETHANOL  SALICYLATE LEVEL    EKG  EKG Interpretation None       Radiology No results found.  Procedures Procedures (including critical care time)  Medications Ordered in ED Medications  nicotine (NICODERM CQ - dosed in mg/24 hours) patch 21 mg (not administered)  aspirin EC tablet 81 mg (not administered)  bisoprolol-hydrochlorothiazide (ZIAC) 5-6.25 MG per tablet 1 tablet (not administered)  gabapentin (NEURONTIN) capsule 300 mg (not administered)  glimepiride (AMARYL) tablet 4 mg (not administered)  simvastatin (ZOCOR) tablet 40 mg (not administered)     Initial Impression / Assessment and Plan / ED Course  I have reviewed the triage vital signs and the nursing notes.  Pertinent labs & imaging results that were available during my care of the patient were reviewed by me and considered in my medical decision making (see chart for details).     Patient presents tonight for SI, auditory hallucinations. He has been medically cleared.  He has been seen and evaluated by TTS who recommends inpatient placement.   Final Clinical Impressions(s) / ED Diagnoses   Final diagnoses:  None   1. SI 2. Auditory hallucinations  ED Discharge Orders    None       Elpidio AnisUpstill, Corneilus Heggie, PA-C 12/04/16 0001    Lorre NickAllen, Anthony, MD 12/06/16 [phone]

## 2016-12-03 NOTE — Discharge Instructions (Signed)
For your shelter needs, contact the following service providers: ° °     [facility] (operated by [facility]) °     [address] °     [place], [place] [address] °     [phone] ° °     [facility] °     [address] °     [place], [place] [address] °     [phone] ° °For day shelter and other supportive services for the homeless, contact the [facility]): ° °     [facility] °     [address] °     [place], [place] [address] °     [phone] ° °For transitional housing, contact one of the following agencies.  They provide longer term housing than a shelter, but there is an application process: ° °     [facility] °     [address] °     [place], [place] [address] °     [phone] ° °     [facility] °     [facility] °     [address] °     [place], [place] [address] °     [phone] °

## 2016-12-03 NOTE — BH Assessment (Addendum)
Tele Assessment Note   Patient Name: Paul Chase MRN: [id number] Referring Physician: Cordelia PocheAllen, A Location of Patient: [facility] Location of Provider: Behavioral Health TTS Department  Paul Chase is an 61 y.o. male who presents to the ED voluntarily. Pt reports he got into an argument with his wife and his brother in law this evening and he had thoughts of stabbing his brother in law. Pt states he has also thought of killing himself by OD on medication. Pt identifies his stressors as being homeless, lack of resources, and ongoing drug abuse. Pt states he has been a "drug addict" for the past 20 years and he feels helpless. [facility] and d/c on 12/03/16 and provided with resources for OPT providers and substance abuse treatment facilities. Pt states he followed up with the resources when [facility] and [facility] but he was under the impression someone would make the phone calls for him to the various SA treatment facilities. Pt states he became hopeless when he was [facility] and the argument with his wife and brother in law infuriated him more and made him want to kill his brother in law and himself. Pt denies that he has ever attempted suicide before but states he has been thinking about it more frequently over the past several days. Pt states he is not from this area and he is unfamiliar with resources which is why he feels depressed and helpless. 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 Pt reports he experiences AH saying "fuck everything, just do what you want to do, fuck everybody."   Per Nira ConnJason Berry, NP pt is recommended for inpt treatment. TTS to seek placement. Pt's nurse Susy FrizzleMatt, RN and EDP Dr. Freida BusmanAllen, MD contacted and notified of disposition.   Diagnosis: Major Depressive Disorder, recurrent, moderate, w/ psychotic features; Cocaine Use Disorder; Alcohol Use  Disorder; Cannabis Use Disorder  Past Medical History:  Past Medical History:  Diagnosis Date  . BPH (benign prostatic hyperplasia)   . Diabetes mellitus without complication (HCC)    type 2  . Glaucoma   . Hyperlipidemia   . Hypertension   . Neuropathy   . Stroke [facility](HCC) 2016    History reviewed. No pertinent surgical history.  Family History: History reviewed. No pertinent family history.  Social History:  reports that he has been smoking cigarettes.  He has been smoking about 1.00 pack per day. He does not have any smokeless tobacco history on file. He reports that he drinks alcohol. He reports that he uses drugs. Drug: Cocaine.  Additional Social History:  Alcohol / Drug Use Pain Medications: See MAR Prescriptions: See MAR Over the Counter: See MAR History of alcohol / drug use?: Yes Substance #1 Name of Substance 1: Cocaine 1 - Age of First Use: 38 1 - Amount (size/oz): 1 gram 1 - Frequency: daily 1 - Duration: ongoing 1 - Last Use / Amount: 12/01/16 Substance #2 Name of Substance 2: Alcohol 2 - Age of First Use: 19 2 - Amount (size/oz): 1 pint of liquor, 6 pack of beer 2 - Frequency: daily 2 - Duration: ongoing 2 - Last Use / Amount: 12/02/16 Substance #3 Name of Substance 3: Marijuana 3 - Age of First Use: unknown 3 - Amount (size/oz): varies 3 - Frequency: varies 3 - Duration: ongoing 3 - Last Use / Amount: 11/30/16, per chart  CIWA: CIWA-Ar BP: (!) 152/88 Pulse Rate: 99 COWS:    PATIENT STRENGTHS: (choose at least two) Average or above average intelligence Capable of independent living Communication skills Financial means General fund of knowledge Motivation for treatment/growth  Allergies: No Known Allergies  Home Medications:  (Not in a hospital admission)  OB/GYN Status:  No LMP for male patient.  General Assessment Data [facility] Assessment[facility] TTS Assessment: In system Is this a Tele or Face-to-Face Assessment?: Tele  Assessment Is this an Initial Assessment or a Re-assessment for this encounter?: Initial Assessment Marital status: Married Is patient pregnant?: No Pregnancy Status: No Living Arrangements: Other (Comment)(homeless) Can pt return to current living arrangement?: Yes Admission Status: Voluntary Is patient capable of signing voluntary admission?: Yes Referral Source: Self/Family/Friend [facility]     Crisis Care Plan Living Arrangements: Other (Comment)(homeless) Name of Psychiatrist: none Name of Therapist: none  Education Status Is patient currently in school?: No Highest grade of school patient has completed: 12th Contact person: self  Risk to self with the past 6 months Suicidal Ideation: Yes-Currently Present Has patient been a risk to self within the past 6 months prior to admission? : No Suicidal Intent: No Has patient had any suicidal intent within the past 6 months prior to admission? : No Is patient at risk for suicide?: Yes Suicidal Plan?: Yes-Currently Present Has patient had any suicidal plan within the past 6 months prior to admission? : Yes Specify Current Suicidal Plan: pt states he has thought of OD on medication  Access to Means: Yes Specify Access to Suicidal Means: pt states he has access to medication  What has been your use of drugs/alcohol within the last 12 months?: reports to daily cocaine and alcohol use  Previous Attempts/Gestures: No Triggers for Past Attempts: None known Intentional Self Injurious Behavior: None Family Suicide History: No Recent stressful life event(s): Conflict (Comment)(conflict with wife and brother in law) Persecutory voices/beliefs?: No Depression: Yes Depression Symptoms: Insomnia, Feeling angry/irritable, Feeling worthless/self pity, Guilt Substance abuse history and/or treatment for substance abuse?: Yes Suicide prevention information given to non-admitted patients: Not applicable  Risk to Others within  the past 6 months Homicidal Ideation: Yes-Currently Present Does patient have any lifetime risk of violence toward others beyond the six months prior to admission? : Yes (comment)(pt states he thought of killing his brother in Social workerlaw) Thoughts of Harm to Others: Yes-Currently Present Comment - Thoughts of Harm to Others: pt states he got into an argument with his brother in law and thought of stabbing him  Current Homicidal Intent: No Current Homicidal Plan: Yes-Currently Present Describe Current Homicidal Plan: pt states he thought of stabbing his brother in law Access to Homicidal Means: Yes Describe Access to Homicidal Means: pt states he has access to knives Identified Victim: brother-in-law History of harm to others?: No Assessment of Violence: On admission Does patient have access to weapons?: Yes (Comment)(pocket knife) Criminal Charges Pending?: No Does patient have a court date: No Is patient on probation?: No  Psychosis Hallucinations: Auditory, With command Delusions: None noted  Mental Status Report Appearance/Hygiene: In scrubs, Unremarkable Eye Contact: Good Motor Activity: Freedom of movement Speech: Logical/coherent Level of Consciousness: Alert Mood: Depressed, Helpless, Despair, Sad, Threatening, Anxious Affect: Anxious, Sad, Sullen, Depressed Anxiety Level: Minimal Thought Processes: Relevant, Coherent Judgement: Impaired Orientation: Person, Place, Time, Situation, Appropriate for developmental age Obsessive Compulsive Thoughts/Behaviors: None  Cognitive Functioning Concentration: Normal Memory: Recent Intact, Remote Intact IQ: Average Insight: Poor Impulse Control:  Fair Appetite: Poor Sleep: Decreased Total Hours of Sleep: 4 Vegetative Symptoms: None  ADLScreening [facility] Assessment Services) Patient's cognitive ability adequate to safely complete daily activities?: Yes Patient able to express need for assistance with ADLs?: Yes Independently performs  ADLs?: Yes (appropriate for developmental age)  Prior Inpatient Therapy Prior Inpatient Therapy: No  Prior Outpatient Therapy Prior Outpatient Therapy: No Does patient have an ACCT team?: No Does patient have Intensive In-House Services?  : No Does patient have [facility] services? : Yes Does patient have P4[facility] services?: No  ADL Screening (condition at time of admission) Patient's cognitive ability adequate to safely complete daily activities?: Yes Is the patient deaf or have difficulty hearing?: No Does the patient have difficulty seeing, even when wearing glasses/contacts?: Yes Does the patient have difficulty concentrating, remembering, or making decisions?: No Patient able to express need for assistance with ADLs?: Yes Does the patient have difficulty dressing or bathing?: No Independently performs ADLs?: Yes (appropriate for developmental age) Does the patient have difficulty walking or climbing stairs?: No Weakness of Legs: None Weakness of Arms/Hands: None  Home Assistive Devices/Equipment Home Assistive Devices/Equipment: None    Abuse/Neglect Assessment (Assessment to be complete while patient is alone) Abuse/Neglect Assessment Can Be Completed: Yes Physical Abuse: Denies Verbal Abuse: Denies Sexual Abuse: Denies Exploitation of patient/patient's resources: Denies Self-Neglect: Denies     Merchant navy officer (For Healthcare) Does Patient Have a Medical Advance Directive?: No Would patient like information on creating a medical advance directive?: No - Patient declined    Additional Information 1:1 In Past 12 Months?: No CIRT Risk: No Elopement Risk: No Does patient have medical clearance?: Yes     Disposition:  Disposition Initial Assessment Completed for this Encounter: Yes Disposition of Patient: Inpatient treatment program Type of inpatient treatment program: Adult(per Nira Conn, NP)  This service was provided via telemedicine using a 2-way,  interactive audio and video technology.  Names of all persons participating in this telemedicine service and their role in this encounter. Name: Paul Chase  Role: Patient  Name: Princess Bruins Role: TTS Counselor           Karolee Ohs 12/03/2016 11:20 PM

## 2016-12-03 NOTE — [facility] Suicide Risk Assessment (Signed)
Suicide Risk Assessment  Discharge Assessment   [facility]   Principal Problem: Cocaine abuse with cocaine-induced mood disorder [facility](HCC) Discharge Diagnoses:  Patient Active Problem List   Diagnosis Date Noted  . Cocaine abuse with cocaine-induced mood disorder [facility](HCC) [F14.14] 12/03/2016    Priority: High  . Alcohol abuse [F10.10]     Total Time spent with patient: 45 minutes  Musculoskeletal: Strength & Muscle Tone: within normal limits Gait & Station: normal Patient leans: N/A  Psychiatric Specialty Exam: Physical Exam  Constitutional: He is oriented to person, place, and time. He appears well-developed and well-nourished.  HENT:  Head: Normocephalic.  Neck: Normal range of motion.  Respiratory: Effort normal.  Musculoskeletal: Normal range of motion.  Neurological: He is alert and oriented to person, place, and time.  Psychiatric: He has a normal mood and affect. His speech is normal and behavior is normal. Judgment and thought content normal. Cognition and memory are normal.    Review of Systems  Psychiatric/Behavioral: Positive for substance abuse.  All other systems reviewed and are negative.   Blood pressure 130/71, pulse 69, temperature 97.9 F (36.6 C), temperature source Oral, resp. rate 16, SpO2 99 %.There is no height or weight on file to calculate BMI.  General Appearance: Casual  Eye Contact:  Good  Speech:  Normal Rate  Volume:  Normal  Mood:  Irritable  Affect:  Congruent  Thought Process:  Coherent and Descriptions of Associations: Intact  Orientation:  Full (Time, Place, and Person)  Thought Content:  WDL and Logical  Suicidal Thoughts:  No  Homicidal Thoughts:  No  Memory:  Immediate;   Good Recent;   Good Remote;   Good  Judgement:  Fair  Insight:  Fair  Psychomotor Activity:  Normal  Concentration:  Concentration: Good and Attention Span: Good  Recall:  Good  Fund of Knowledge:  Good  Language:  Good  Akathisia:   No  Handed:  Right  AIMS (if indicated):     Assets:  Leisure Time Physical Health Resilience Social Support  ADL's:  Intact  Cognition:  WNL  Sleep:       Mental Status Per Nursing Assessment::   On Admission:   cocaine abuse with suicidal ideations  Demographic Factors:  Male  Loss Factors: NA  Historical Factors: NA  Risk Reduction Factors:   Sense of responsibility to family, Living with another person, especially a relative and Positive social support  Continued Clinical Symptoms:  Irritable    Cognitive Features That Contribute To Risk:  None    Suicide Risk:  Minimal: No identifiable suicidal ideation.  Patients presenting with no risk factors but with morbid ruminations; may be classified as minimal risk based on the severity of the depressive symptoms    Plan Of Care/Follow-up recommendations:  Activity:  as tolerated Diet:  heart healthy diet  Akiva Josey, NP 12/03/2016, 12:20 PM

## 2016-12-03 NOTE — ED Notes (Signed)
Pt discharged home. Discharged instructions read to pt who verbalized understanding. All belongings returned to pt who signed for same. Denies SI/HI, is not delusional and not responding to internal stimuli. Escorted pt to the ED exit. Pt given bus ticket.   

## 2016-12-03 NOTE — Consult Note (Signed)
[facility] Face-to-Face Psychiatry Consult   Reason for Consult:  Cocaine abuse with suicidal ideations Referring Physician:  EDP Patient Identification: Paul Chase MRN:  [id number] Principal Diagnosis: Cocaine abuse with cocaine-induced mood disorder [facility]) Diagnosis:   Patient Active Problem List   Diagnosis Date Noted  . Cocaine abuse with cocaine-induced mood disorder [facility]) [F14.14] 12/03/2016    Priority: High    Total Time spent with patient: 45 minutes  Subjective:   Paul Chase is a 60 y.o. male patient does not warrant admission.  HPI:  61 yo male who presented to the ED after using cocaine and having suicidal ideations along with his wife with similar issues.  Today, he is clear and coherent with no suicidal/homicidal ideations, hallucinations, or withdrawal symptoms.  He wanted long-term rehab, Peer Support met with him and gave him resources. Stable for discharge.  Past Psychiatric History: substance abuse  Risk to Self: None Risk to Others: Homicidal Ideation: No-Not Currently/Within Last 6 Months Thoughts of Harm to Others: No-Not Currently Present/Within Last 6 Months Current Homicidal Intent: No Current Homicidal Plan: No Access to Homicidal Means: No(Pt denies. ) Identified Victim: Pt reported, if someone mess with him.  History of harm to others?: No Assessment of Violence: None Noted Violent Behavior Description: NA Does patient have access to weapons?: No(Pt denies. ) Criminal Charges Pending?: No Does patient have a court date: No Prior Inpatient Therapy: Prior Inpatient Therapy: No Prior Therapy Dates: NA Prior Therapy Facilty/Provider(s): NA Reason for Treatment: NA Prior Outpatient Therapy: Prior Outpatient Therapy: No Prior Therapy Dates: NA Prior Therapy Facilty/Provider(s): NA Reason for Treatment: NA Does patient have an ACCT team?: No Does patient have Intensive In-House Services?  : No Does patient have [facility] services? : No Does patient have  P4CC services?: No  Past Medical History:  Past Medical History:  Diagnosis Date  . BPH (benign prostatic hyperplasia)   . Diabetes mellitus without complication ([place])    type 2  . Glaucoma   . Hyperlipidemia   . Hypertension   . Neuropathy   . Stroke [facility]) 2016   History reviewed. No pertinent surgical history. Family History: No family history on file. Family Psychiatric  History: none Social History:  Social History   Substance and Sexual Activity  Alcohol Use Yes     Social History   Substance and Sexual Activity  Drug Use Yes  . Types: Cocaine    Social History   Socioeconomic History  . Marital status: Married    Spouse name: None  . Number of children: None  . Years of education: None  . Highest education level: None  Social Needs  . Financial resource strain: None  . Food insecurity - worry: None  . Food insecurity - inability: None  . Transportation needs - medical: None  . Transportation needs - non-medical: None  Occupational History  . None  Tobacco Use  . Smoking status: Current Every Day Smoker    Packs/day: 1.00    Types: Cigarettes  Substance and Sexual Activity  . Alcohol use: Yes  . Drug use: Yes    Types: Cocaine  . Sexual activity: None  Other Topics Concern  . None  Social History Narrative  . None   Additional Social History: N/A    Allergies:  No Known Allergies  Labs:  Results for orders placed or performed during the hospital encounter of 12/02/16 (from the past 48 hour(s))  Comprehensive metabolic panel     Status: Abnormal   Collection  Time: 12/02/16  2:36 PM  Result Value Ref Range   Sodium 137 135 - 145 mmol/L   Potassium 3.6 3.5 - 5.1 mmol/L   Chloride 102 101 - 111 mmol/L   CO2 23 22 - 32 mmol/L   Glucose, Bld 59 (L) 65 - 99 mg/dL   BUN 9 6 - 20 mg/dL   Creatinine, Ser 1.29 (H) 0.61 - 1.24 mg/dL   Calcium 9.1 8.9 - 10.3 mg/dL   Total Protein 7.8 6.5 - 8.1 g/dL   Albumin 4.0 3.5 - 5.0 g/dL   AST 16 15 - 41 U/L    ALT 17 17 - 63 U/L   Alkaline Phosphatase 59 38 - 126 U/L   Total Bilirubin 0.6 0.3 - 1.2 mg/dL   GFR calc non Af Amer 57 (L) >60 mL/min   GFR calc Af Amer >60 >60 mL/min    Comment: (NOTE) The eGFR has been calculated using the CKD EPI equation. This calculation has not been validated in all clinical situations. eGFR's persistently <60 mL/min signify possible Chronic Kidney Disease.    Anion gap 12 5 - 15  Ethanol     Status: Abnormal   Collection Time: 12/02/16  2:36 PM  Result Value Ref Range   Alcohol, Ethyl (B) 34 (H) <10 mg/dL    Comment:        LOWEST DETECTABLE LIMIT FOR SERUM ALCOHOL IS 10 mg/dL FOR MEDICAL PURPOSES ONLY   Salicylate level     Status: None   Collection Time: 12/02/16  2:36 PM  Result Value Ref Range   Salicylate Lvl <2.3 2.8 - 30.0 mg/dL  Acetaminophen level     Status: Abnormal   Collection Time: 12/02/16  2:36 PM  Result Value Ref Range   Acetaminophen (Tylenol), Serum <10 (L) 10 - 30 ug/mL    Comment:        THERAPEUTIC CONCENTRATIONS VARY SIGNIFICANTLY. A RANGE OF 10-30 ug/mL MAY BE AN EFFECTIVE CONCENTRATION FOR MANY PATIENTS. HOWEVER, SOME ARE BEST TREATED AT CONCENTRATIONS OUTSIDE THIS RANGE. ACETAMINOPHEN CONCENTRATIONS >150 ug/mL AT 4 HOURS AFTER INGESTION AND >50 ug/mL AT 12 HOURS AFTER INGESTION ARE OFTEN ASSOCIATED WITH TOXIC REACTIONS.   cbc     Status: Abnormal   Collection Time: 12/02/16  2:36 PM  Result Value Ref Range   WBC 10.7 (H) 4.0 - 10.5 K/uL   RBC 4.83 4.22 - 5.81 MIL/uL   Hemoglobin 14.6 13.0 - 17.0 g/dL   HCT 43.1 39.0 - 52.0 %   MCV 89.2 78.0 - 100.0 fL   MCH 30.2 26.0 - 34.0 pg   MCHC 33.9 30.0 - 36.0 g/dL   RDW 14.6 11.5 - 15.5 %   Platelets 371 150 - 400 K/uL  Rapid urine drug screen (hospital performed)     Status: Abnormal   Collection Time: 12/02/16  2:39 PM  Result Value Ref Range   Opiates NONE DETECTED NONE DETECTED   Cocaine POSITIVE (A) NONE DETECTED   Benzodiazepines NONE DETECTED NONE  DETECTED   Amphetamines NONE DETECTED NONE DETECTED   Tetrahydrocannabinol NONE DETECTED NONE DETECTED   Barbiturates NONE DETECTED NONE DETECTED    Comment:        DRUG SCREEN FOR MEDICAL PURPOSES ONLY.  IF CONFIRMATION IS NEEDED FOR ANY PURPOSE, NOTIFY LAB WITHIN 5 DAYS.        LOWEST DETECTABLE LIMITS FOR URINE DRUG SCREEN Drug Class       Cutoff (ng/mL) Amphetamine      1000 Barbiturate  200 Benzodiazepine   024 Tricyclics       097 Opiates          300 Cocaine          300 THC              50   CBG monitoring, ED     Status: Abnormal   Collection Time: 12/02/16  3:48 PM  Result Value Ref Range   Glucose-Capillary 47 (L) 65 - 99 mg/dL   Comment 1 Document in Chart    Comment 2 Call MD NNP PA CNM   CBG monitoring, ED     Status: Abnormal   Collection Time: 12/02/16  4:37 PM  Result Value Ref Range   Glucose-Capillary 146 (H) 65 - 99 mg/dL  CBG monitoring, ED     Status: Abnormal   Collection Time: 12/02/16  5:53 PM  Result Value Ref Range   Glucose-Capillary 108 (H) 65 - 99 mg/dL    Current Facility-Administered Medications  Medication Dose Route Frequency Provider Last Rate Last Dose  . acetaminophen (TYLENOL) tablet 650 mg  650 mg Oral Q4H PRN Street, [place], [place]      . alum & mag hydroxide-simeth (MAALOX/MYLANTA) 200-200-20 MG/5ML suspension 30 mL  30 mL Oral Q6H PRN Street, [place], [place]      . aspirin EC tablet 81 mg  81 mg Oral Daily [address] [place], [place]-C   81 mg at 12/03/16 0915  . bisoprolol-hydrochlorothiazide (ZIAC) 5-6.25 MG per tablet 1 tablet  1 tablet Oral Daily [address], [place], [place]   1 tablet at 12/03/16 0916  . gabapentin (NEURONTIN) capsule 300 mg  300 mg Oral BID [address], [place], [place]   300 mg at 12/03/16 0915  . glimepiride (AMARYL) tablet 4 mg  4 mg Oral Q breakfast [address] [place], [place]   4 mg at 12/03/16 3532  . latanoprost (XALATAN) 0.005 % ophthalmic solution 1 drop  1 drop Both Eyes QHS [place], Grayce Sessions, MD   1 drop at  12/02/16 2149  . LORazepam (ATIVAN) tablet 0-4 mg  0-4 mg Oral [address], [place], [place]   1 mg at 12/03/16 0701  . [START ON 12/05/2016] LORazepam (ATIVAN) tablet 0-4 mg  0-4 mg Oral Q12H Street, [place], [place]      . nicotine (NICODERM CQ - dosed in mg/24 hours) patch 21 mg  21 mg Transdermal Daily [address], [place], PA-C   21 mg at 12/03/16 1017  . ondansetron (ZOFRAN) tablet 4 mg  4 mg Oral Q8H PRN Street, [place], [place]      . simvastatin (ZOCOR) tablet 40 mg  40 mg Oral [address] [place], [place]   40 mg at 12/02/16 2150  . tamsulosin (FLOMAX) capsule 0.4 mg  0.4 mg Oral BID [address], [place], [place]   0.4 mg at 12/03/16 0915  . thiamine (VITAMIN B-1) tablet 100 mg  100 mg Oral Daily [address] [place], [place]   100 mg at 12/03/16 9924   Or  . thiamine (B-1) injection 100 mg  100 mg Intravenous Daily [address], [place], [place]      . zolpidem (AMBIEN) tablet 5 mg  5 mg Oral QHS PRN Street, [place], [place]   5 mg at 12/02/16 2204   Current Outpatient Medications  Medication Sig Dispense Refill  . aspirin EC 81 MG tablet Take 81 mg daily by mouth.    . bisoprolol-hydrochlorothiazide (ZIAC) 5-6.25 MG tablet Take 1 tablet daily by mouth.    . gabapentin (NEURONTIN) 300 MG capsule Take 300 mg  2 (two) times daily by mouth.    Marland Kitchen glimepiride (AMARYL) 4 MG tablet Take 4 mg daily with breakfast by mouth.    . simvastatin (ZOCOR) 40 MG tablet Take 40 mg at bedtime by mouth.    . tamsulosin (FLOMAX) 0.4 MG CAPS capsule Take 0.4 mg 2 (two) times daily by mouth.    . travoprost, benzalkonium, (TRAVATAN) 0.004 % ophthalmic solution Place 1 drop at bedtime into both eyes.      Musculoskeletal: Strength & Muscle Tone: within normal limits Gait & Station: normal Patient leans: N/A  Psychiatric Specialty Exam: Physical Exam  Nursing note and vitals reviewed. Constitutional: He is oriented to person, place, and time. He appears well-developed and well-nourished.  HENT:  Head: Normocephalic.  Neck: Normal  range of motion.  Respiratory: Effort normal.  Musculoskeletal: Normal range of motion.  Neurological: He is alert and oriented to person, place, and time.  Psychiatric: He has a normal mood and affect. His speech is normal and behavior is normal. Judgment and thought content normal. Cognition and memory are normal.    Review of Systems  Psychiatric/Behavioral: Positive for substance abuse.  All other systems reviewed and are negative.   Blood pressure 130/71, pulse 69, temperature 97.9 F (36.6 C), temperature source Oral, resp. rate 16, SpO2 99 %.There is no height or weight on file to calculate BMI.  General Appearance: Casual  Eye Contact:  Good  Speech:  Normal Rate  Volume:  Normal  Mood:  Irritable  Affect:  Congruent  Thought Process:  Coherent and Descriptions of Associations: Intact  Orientation:  Full (Time, Place, and Person)  Thought Content:  WDL and Logical  Suicidal Thoughts:  No  Homicidal Thoughts:  No  Memory:  Immediate;   Good Recent;   Good Remote;   Good  Judgement:  Fair  Insight:  Fair  Psychomotor Activity:  Normal  Concentration:  Concentration: Good and Attention Span: Good  Recall:  Good  Fund of Knowledge:  Good  Language:  Good  Akathisia:  No  Handed:  Right  AIMS (if indicated):     Assets:  Leisure Time Physical Health Resilience Social Support  ADL's:  Intact  Cognition:  WNL  Sleep:        Treatment Plan Summary: Daily contact with patient to assess and evaluate symptoms and progress in treatment, Medication management and Plan cocaine abuse with cocaine induced mood disorder:  -Crisis stabilization -Medication management:  Medical medication started along with Gabapentin 300 mg BID for withdrawal symptoms -Individual counseling  Disposition: No evidence of imminent risk to self or others at present.    Waylan Boga, NP 12/03/2016 10:36 AM   Patient seen face-to-face for psychiatric evaluation, chart reviewed and case  discussed with the physician extender and developed treatment plan. Reviewed the information documented and agree with the treatment plan.  Buford Dresser, DO

## 2016-12-04 NOTE — ED Provider Notes (Signed)
2:00 AM  [facility] 10am per Dr. Roselyn Reefreque.   Roberth Berling, Layla MawKristen N, DO 12/04/16 [phone]

## 2016-12-04 NOTE — Progress Notes (Signed)
Pt has been faxed to the following inpt facilities for possible admission:  [place], [facility], [facility], [facility], [facility], [facility], [facility]  Princess BruinsAquicha Brandley Aldrete, MSW, [place] Therapeutic Triage Specialist  [phone]

## 2016-12-04 NOTE — ED Notes (Signed)
Attempted to call report x 2 - left message w/staff member for RN to call.

## 2016-12-04 NOTE — ED Notes (Signed)
Attempted to call report - left voicemail.

## 2016-12-04 NOTE — ED Notes (Signed)
[facility] accepted [facility] but not able to transport til after 10am

## 2016-12-04 NOTE — BH Assessment (Signed)
At 1:50am, pt was [facility] by Annice PihJackie. Patient to be transported on 11/10 after 10am and [facility]. Attending physician: Dr. Roselyn Reefreque. Nursing report: [phone]. Updated disposition discussed with Dr. Elesa MassedWard and Gabriel RungMonique, RN.

## 2016-12-04 NOTE — ED Notes (Signed)
Pt aware and is in agreement w/tx plan - [facility]. Pt states he will call family member/friend in a few minutes.

## 2016-12-04 NOTE — ED Notes (Signed)
Pt called his wife and advised of tx plan - will be transported to [facility] This am.

## 2016-12-04 NOTE — ED Notes (Signed)
Representative with [facility] called to verify patient's current status

## 2017-04-14 ENCOUNTER — Encounter (HOSPITAL_COMMUNITY): Payer: Self-pay

## 2017-04-14 ENCOUNTER — Observation Stay (HOSPITAL_COMMUNITY)
Admission: EM | Admit: 2017-04-14 | Discharge: 2017-04-15 | Disposition: A | Discharge status: Home / Self Care | Payer: Medicare Other | Attending: Internal Medicine | Admitting: Internal Medicine

## 2017-04-14 ENCOUNTER — Other Ambulatory Visit: Payer: Self-pay

## 2017-04-14 ENCOUNTER — Emergency Department (HOSPITAL_COMMUNITY): Payer: Medicare Other

## 2017-04-14 DIAGNOSIS — R310 Gross hematuria: Secondary | ICD-10-CM | POA: Insufficient documentation

## 2017-04-14 DIAGNOSIS — Z8042 Family history of malignant neoplasm of prostate: Secondary | ICD-10-CM | POA: Diagnosis not present

## 2017-04-14 DIAGNOSIS — Z8673 Personal history of transient ischemic attack (TIA), and cerebral infarction without residual deficits: Secondary | ICD-10-CM | POA: Diagnosis not present

## 2017-04-14 DIAGNOSIS — H409 Unspecified glaucoma: Secondary | ICD-10-CM | POA: Insufficient documentation

## 2017-04-14 DIAGNOSIS — R338 Other retention of urine: Secondary | ICD-10-CM | POA: Diagnosis not present

## 2017-04-14 DIAGNOSIS — E1140 Type 2 diabetes mellitus with diabetic neuropathy, unspecified: Secondary | ICD-10-CM | POA: Diagnosis not present

## 2017-04-14 DIAGNOSIS — Z7982 Long term (current) use of aspirin: Secondary | ICD-10-CM | POA: Insufficient documentation

## 2017-04-14 DIAGNOSIS — F17210 Nicotine dependence, cigarettes, uncomplicated: Secondary | ICD-10-CM | POA: Insufficient documentation

## 2017-04-14 DIAGNOSIS — N401 Enlarged prostate with lower urinary tract symptoms: Principal | ICD-10-CM | POA: Insufficient documentation

## 2017-04-14 DIAGNOSIS — R3915 Urgency of urination: Secondary | ICD-10-CM | POA: Insufficient documentation

## 2017-04-14 DIAGNOSIS — N323 Diverticulum of bladder: Secondary | ICD-10-CM | POA: Insufficient documentation

## 2017-04-14 DIAGNOSIS — N3289 Other specified disorders of bladder: Secondary | ICD-10-CM | POA: Diagnosis not present

## 2017-04-14 DIAGNOSIS — R3912 Poor urinary stream: Secondary | ICD-10-CM | POA: Diagnosis not present

## 2017-04-14 DIAGNOSIS — Z79899 Other long term (current) drug therapy: Secondary | ICD-10-CM | POA: Diagnosis not present

## 2017-04-14 DIAGNOSIS — R339 Retention of urine, unspecified: Secondary | ICD-10-CM | POA: Diagnosis present

## 2017-04-14 DIAGNOSIS — I10 Essential (primary) hypertension: Secondary | ICD-10-CM | POA: Insufficient documentation

## 2017-04-14 DIAGNOSIS — E785 Hyperlipidemia, unspecified: Secondary | ICD-10-CM | POA: Insufficient documentation

## 2017-04-14 DIAGNOSIS — Z7984 Long term (current) use of oral hypoglycemic drugs: Secondary | ICD-10-CM | POA: Insufficient documentation

## 2017-04-14 LAB — URINALYSIS, ROUTINE W REFLEX MICROSCOPIC
Bilirubin Urine: NEGATIVE
GLUCOSE, UA: NEGATIVE mg/dL
HGB URINE DIPSTICK: NEGATIVE
Ketones, ur: NEGATIVE mg/dL
Leukocytes, UA: NEGATIVE
Nitrite: NEGATIVE
PH: 6 (ref 5.0–8.0)
PROTEIN: NEGATIVE mg/dL
Specific Gravity, Urine: 1.005 (ref 1.005–1.030)

## 2017-04-14 LAB — CBC WITH DIFFERENTIAL/PLATELET
BASOS ABS: 0 10*3/uL (ref 0.0–0.1)
Basophils Relative: 0 %
EOS PCT: 3 %
Eosinophils Absolute: 0.3 10*3/uL (ref 0.0–0.7)
HCT: 46.1 % (ref 39.0–52.0)
Hemoglobin: 15.2 g/dL (ref 13.0–17.0)
Lymphocytes Relative: 37 %
Lymphs Abs: 3.1 10*3/uL (ref 0.7–4.0)
MCH: 29.4 pg (ref 26.0–34.0)
MCHC: 33 g/dL (ref 30.0–36.0)
MCV: 89.2 fL (ref 78.0–100.0)
MONO ABS: 0.6 10*3/uL (ref 0.1–1.0)
MONOS PCT: 7 %
NEUTROS ABS: 4.5 10*3/uL (ref 1.7–7.7)
Neutrophils Relative %: 53 %
PLATELETS: 340 10*3/uL (ref 150–400)
RBC: 5.17 MIL/uL (ref 4.22–5.81)
RDW: 15 % (ref 11.5–15.5)
WBC: 8.4 10*3/uL (ref 4.0–10.5)

## 2017-04-14 LAB — COMPREHENSIVE METABOLIC PANEL
ALT: 13 U/L — ABNORMAL LOW (ref 17–63)
ANION GAP: 7 (ref 5–15)
AST: 16 U/L (ref 15–41)
Albumin: 4.1 g/dL (ref 3.5–5.0)
Alkaline Phosphatase: 70 U/L (ref 38–126)
BUN: 5 mg/dL — AB (ref 6–20)
CO2: 28 mmol/L (ref 22–32)
Calcium: 9.1 mg/dL (ref 8.9–10.3)
Chloride: 102 mmol/L (ref 101–111)
Creatinine, Ser: 1.11 mg/dL (ref 0.61–1.24)
Glucose, Bld: 91 mg/dL (ref 65–99)
POTASSIUM: 4 mmol/L (ref 3.5–5.1)
Sodium: 137 mmol/L (ref 135–145)
TOTAL PROTEIN: 7.5 g/dL (ref 6.5–8.1)
Total Bilirubin: 0.4 mg/dL (ref 0.3–1.2)

## 2017-04-14 LAB — LIPASE, BLOOD: LIPASE: 22 U/L (ref 11–51)

## 2017-04-14 MED ORDER — TAMSULOSIN HCL 0.4 MG PO CAPS
0.4000 mg | ORAL_CAPSULE | Freq: Every day | ORAL | Status: DC
  Administered 2017-04-15: 0.4 mg via ORAL
  Filled 2017-04-14: qty 1

## 2017-04-14 MED ORDER — ACETAMINOPHEN 650 MG RE SUPP: 650.0000 mg | Freq: Four times a day (QID) | RECTAL | Status: DC | PRN

## 2017-04-14 MED ORDER — DIAZEPAM 5 MG/ML IJ SOLN
2.5000 mg | Freq: Once | INTRAMUSCULAR | Status: AC
  Administered 2017-04-14: 2.5 mg via INTRAVENOUS
  Filled 2017-04-14: qty 2

## 2017-04-14 MED ORDER — ACETAMINOPHEN 325 MG PO TABS: 650.0000 mg | ORAL_TABLET | Freq: Four times a day (QID) | ORAL | Status: DC | PRN

## 2017-04-14 MED ORDER — HYDRALAZINE HCL 20 MG/ML IJ SOLN
10.0000 mg | Freq: Once | INTRAMUSCULAR | Status: AC
  Administered 2017-04-14: 10 mg via INTRAVENOUS
  Filled 2017-04-14: qty 1

## 2017-04-14 MED ORDER — BISOPROLOL-HYDROCHLOROTHIAZIDE 5-6.25 MG PO TABS: 1.0000 | ORAL_TABLET | Freq: Every day | ORAL | Status: DC

## 2017-04-14 MED ORDER — CEPHALEXIN 250 MG PO CAPS
500.0000 mg | ORAL_CAPSULE | Freq: Once | ORAL | Status: AC
  Administered 2017-04-14: 500 mg via ORAL
  Filled 2017-04-14: qty 2

## 2017-04-14 MED ORDER — SODIUM CHLORIDE 0.9 % IV BOLUS (SEPSIS): 1000.0000 mL | Freq: Once | INTRAVENOUS | Status: DC

## 2017-04-14 MED ORDER — SODIUM CHLORIDE 0.9 % IV SOLN
1.0000 g | Freq: Once | INTRAVENOUS | Status: AC
  Administered 2017-04-14: 1 g via INTRAVENOUS
  Filled 2017-04-14: qty 10

## 2017-04-14 MED ORDER — IOPAMIDOL (ISOVUE-300) INJECTION 61%
INTRAVENOUS | Status: AC
  Administered 2017-04-14: 100 mL
  Filled 2017-04-14: qty 100

## 2017-04-14 MED ORDER — TAMSULOSIN HCL 0.4 MG PO CAPS: 0.4000 mg | ORAL_CAPSULE | Freq: Two times a day (BID) | ORAL | Status: DC

## 2017-04-14 MED ORDER — SENNOSIDES-DOCUSATE SODIUM 8.6-50 MG PO TABS: 1.0000 | ORAL_TABLET | Freq: Every evening | ORAL | Status: DC | PRN

## 2017-04-14 MED ORDER — SODIUM CHLORIDE 0.9 % IV BOLUS (SEPSIS)
1000.0000 mL | Freq: Once | INTRAVENOUS | Status: AC
  Administered 2017-04-14: 1000 mL via INTRAVENOUS

## 2017-04-14 NOTE — ED Notes (Signed)
Admitting MD at bedside.

## 2017-04-14 NOTE — ED Triage Notes (Signed)
Pt states he feels like he has had urinary urgency X1 week, states worsened over the last few weeks. Pt reports some suprapubic pain. Pt reports feeling some pressure on his rectum.

## 2017-04-14 NOTE — ED Notes (Signed)
Pt voided, this NT scanned pt bladder. Bladder volume >999. Notified Jessica(RN)

## 2017-04-14 NOTE — ED Provider Notes (Addendum)
[facility] EMERGENCY DEPARTMENT Provider Note   CSN: [id number] Arrival date & time: 04/14/17  1046     History   Chief Complaint Chief Complaint  Patient presents with  . Urinary Frequency    HPI Paul Chase is a 62 y.o. male history of diabetes, hypertension, hyperlipidemia, previous stroke here presenting with urinary urgency and trouble urinating.  States that for the last week or so he has trouble emptying his bladder and states very little comes out when he urinates.  He denies any dysuria or hematuria.  He has a history of BPH never had any surgery previously.  Denies any nausea vomiting or fevers or flank pain.  Patient had a checkup with primary care doctor earlier in the month and had unremarkable labs.  Patient had never seen a urologist before.  The history is provided by the patient.    Past Medical History:  Diagnosis Date  . BPH (benign prostatic hyperplasia)   . Diabetes mellitus without complication ([facility])    type 2  . Glaucoma   . Hyperlipidemia   . Hypertension   . Neuropathy   . Stroke [facility]) 2016    Patient Active Problem List   Diagnosis Date Noted  . Cocaine abuse with cocaine-induced mood disorder ([facility]) 12/03/2016  . Alcohol abuse     History reviewed. No pertinent surgical history.     Home Medications    Prior to Admission medications   Medication Sig Start Date End Date Taking? Authorizing Provider  aspirin EC 81 MG tablet Take 81 mg daily by mouth.   Yes [provider]  tamsulosin (FLOMAX) 0.4 MG CAPS capsule Take 0.4 mg 2 (two) times daily by mouth.   Yes [provider]  bisoprolol-hydrochlorothiazide (ZIAC) 5-6.25 MG tablet Take 1 tablet daily by mouth.    [provider]  gabapentin (NEURONTIN) 300 MG capsule Take 300 mg 2 (two) times daily by mouth.    [provider]  glimepiride (AMARYL) 4 MG tablet Take 4 mg daily with breakfast by mouth.    [provider]    simvastatin (ZOCOR) 40 MG tablet Take 40 mg at bedtime by mouth.    [provider]  travoprost, benzalkonium, (TRAVATAN) 0.004 % ophthalmic solution Place 1 drop at bedtime into both eyes.    [provider]    Family History History reviewed. No pertinent family history.  Social History Social History   Tobacco Use  . Smoking status: Current Every Day Smoker    Packs/day: 1.00    Types: Cigarettes  . Smokeless tobacco: Never Used  Substance Use Topics  . Alcohol use: Yes    Comment: rare  . Drug use: Yes    Types: Cocaine    Comment: "as much as I can"     Allergies   Patient has no known allergies.   Review of Systems Review of Systems  Gastrointestinal: Positive for abdominal pain.  All other systems reviewed and are negative.    Physical Exam Updated Vital Signs BP (!) 173/91 (BP Location: Right Arm)   Pulse 78   Temp 98.2 F (36.8 C) (Oral)   Resp 16   SpO2 98%   Physical Exam  Constitutional: He is oriented to person, place, and time. He appears well-developed and well-nourished.  HENT:  Head: Normocephalic.  Mouth/Throat: Oropharynx is clear and moist.  Eyes: Pupils are equal, round, and reactive to light. Conjunctivae and EOM are normal.  Neck: Normal range of motion. Neck  supple.  Cardiovascular: Normal rate, regular rhythm and normal heart sounds.  Pulmonary/Chest: Effort normal and breath sounds normal. No stridor. No respiratory distress. He has no wheezes.  Abdominal: Soft. Bowel sounds are normal.  + distended bladder, minimally tender   Genitourinary:  Genitourinary Comments: Rectal- enlarged prostate, nontender   Musculoskeletal: Normal range of motion.  Neurological: He is alert and oriented to person, place, and time.  Skin: Skin is warm.  Psychiatric: He has a normal mood and affect.  Nursing note and vitals reviewed.    ED Treatments / Results  Labs (all labs ordered are listed, but only abnormal results are  displayed) Labs Reviewed  URINALYSIS, ROUTINE W REFLEX MICROSCOPIC - Abnormal; Notable for the following components:      Result Value   Color, Urine STRAW (*)    All other components within normal limits  COMPREHENSIVE METABOLIC PANEL - Abnormal; Notable for the following components:   BUN 5 (*)    ALT 13 (*)    All other components within normal limits  URINE CULTURE  CBC WITH DIFFERENTIAL/PLATELET  LIPASE, BLOOD    EKG  EKG Interpretation None       Radiology Ct Abdomen Pelvis W Contrast  Result Date: 04/14/2017 CLINICAL DATA:  Abdominal distension, hematuria, urinary retention and urgency for 1 week, rectal pressure, worsening symptoms, history BPH, hypertension, diabetes mellitus EXAM: CT ABDOMEN AND PELVIS WITH CONTRAST TECHNIQUE: Multidetector CT imaging of the abdomen and pelvis was performed using the standard protocol following bolus administration of intravenous contrast. Sagittal and coronal MPR images reconstructed from axial data set. CONTRAST:  100mL ISOVUE-300 IOPAMIDOL (ISOVUE-300) INJECTION 61% IV. No oral contrast. COMPARISON:  None FINDINGS: Lower chest: Minimal dependent atelectasis at lung bases Hepatobiliary: Gallbladder and liver normal appearance Pancreas: Normal appearance Spleen: Normal appearance.  Tiny splenule. Adrenal/urinary tract: RIGHT adrenal gland normal. Tiny nonspecific LEFT adrenal nodule 12 x 7 mm image 18. Kidneys normal appearance without mass or hydronephrosis. No urinary tract calcification. Upper normal caliber ureters. Foley catheter within urinary bladder. Marked diffuse bladder wall thickening up to 2.3 cm thick. This could represent severe cystitis but tumor is not excluded with this appearance. Suspect small RIGHT lateral bladder diverticulum. Stomach/Bowel: Appendix not visualized but no pericecal inflammatory process is seen. Stomach and bowel loops otherwise normal appearance Vascular/Lymphatic: Atherosclerotic calcifications aorta and  iliac arteries without aneurysm. No adenopathy. Reproductive: Prostatic enlargement, gland measuring 6.3 x 4.6 x 4.5 cm. Other: No free air or free fluid. Small umbilical hernia containing fat. Musculoskeletal: Degenerative disc disease changes L5-S1. IMPRESSION: Marked diffuse bladder wall thickening question cystitis versus tumor; correlation with cystoscopy recommended. 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 Tiny nonspecific LEFT adrenal nodule 12 x 7 mm. Prostatic enlargement. Small umbilical hernia containing fat. Electronically Signed   By: Ulyses SouthwardMark  Boles M.D.   On: 04/14/2017 20:28    Procedures Irrigation Date/Time: 04/14/2017 9:30 PM Performed by: Charlynne PanderYao, Tashaun Obey Hsienta, MD Authorized by: Charlynne PanderYao, Kaelin Holford Hsienta, MD  Consent: Verbal consent obtained. Consent given by: patient Patient understanding: patient states understanding of the procedure being performed Patient consent: the patient's understanding of the procedure matches consent given Patient identity confirmed: verbally with patient Preparation: Patient was prepped and draped in the usual sterile fashion. Local anesthesia used: no  Anesthesia: Local anesthesia used: no  Sedation: Patient sedated: no  Patient tolerance: Patient tolerated the procedure well with no immediate complications Comments: Irrigated foley with 200 cc NS. Some bloody urine came out, minimal clots.     (  including critical care time)\  EMERGENCY DEPARTMENT ULTRASOUND  Study: Limited Ultrasound of Bladder  INDICATIONS: to assess for urinary retention and/or bladder volume prior to urinary catheter Multiple views of the bladder were obtained in real-time in the transverse and longitudinal planes with a multi-frequency probe.  PERFORMED BY: Myself IMAGES ARCHIVED?: Yes LIMITATIONS:  none INTERPRETATION: Large Volume        Medications Ordered in ED Medications  cefTRIAXone (ROCEPHIN) 1 g in sodium chloride 0.9 % 100 mL IVPB (has no  administration in time range)  sodium chloride 0.9 % bolus 1,000 mL (0 mLs Intravenous Stopped 04/14/17 1700)  sodium chloride 0.9 % bolus 1,000 mL (0 mLs Intravenous Stopped 04/14/17 1959)  hydrALAZINE (APRESOLINE) injection 10 mg (10 mg Intravenous Given 04/14/17 1947)  diazepam (VALIUM) injection 2.5 mg (2.5 mg Intravenous Given 04/14/17 1950)  iopamidol (ISOVUE-300) 61 % injection (100 mLs  Contrast Given 04/14/17 2000)  cephALEXin (KEFLEX) capsule 500 mg (500 mg Oral Given 04/14/17 2104)     Initial Impression / Assessment and Plan / ED Course  I have reviewed the triage vital signs and the nursing notes.  Pertinent labs & imaging results that were available during my care of the patient were reviewed by me and considered in my medical decision making (see chart for details).     Paul Loeper is a 62 y.o. male here with abdominal pain, trouble urinating. He has bladder scan that is greater than 1000 cc after voiding. Bedside US also confirmed urinary retention. Paul check UA, chemistry. Paul place foley for retention. Urinary retention likely from BPH.    6 pm There was about 1000 cc clear urine came out. Then it became bloody. I irrigated foley and some clots came out. Suspect some prostate injury from foley catheter. Paul continue to monitor.   7:30 pm Still has some hematuria. BP up to 200. Paul get CT ab/pel to further assess.   9:31 PM CT showed cystitis vs bladder tumor. Still some mild hematuria from the foley, no clots. Irrigated again and it clears but then bloody urine comes out. BP improved to 170s. I called urology to see patient.   10:37 PM Called Dr. Ronne Binning who Paul see patient in AM. Internal medicine teaching service to admit for uncontrolled HTN, hematuria. Given rocephin. Doesn't appear septic.    Final Clinical Impressions(s) / ED Diagnoses   Final diagnoses:  None    ED Discharge Orders    None       Charlynne Pander, MD 04/14/17 2238    Charlynne Pander, MD 04/14/17 2241

## 2017-04-14 NOTE — ED Notes (Signed)
ED Provider at bedside. 

## 2017-04-14 NOTE — ED Notes (Signed)
Patient transported to CT 

## 2017-04-15 ENCOUNTER — Encounter (HOSPITAL_COMMUNITY): Payer: Self-pay | Admitting: *Deleted

## 2017-04-15 ENCOUNTER — Other Ambulatory Visit: Payer: Self-pay

## 2017-04-15 DIAGNOSIS — R350 Frequency of micturition: Secondary | ICD-10-CM

## 2017-04-15 DIAGNOSIS — H409 Unspecified glaucoma: Secondary | ICD-10-CM

## 2017-04-15 DIAGNOSIS — E785 Hyperlipidemia, unspecified: Secondary | ICD-10-CM | POA: Diagnosis not present

## 2017-04-15 DIAGNOSIS — F17210 Nicotine dependence, cigarettes, uncomplicated: Secondary | ICD-10-CM

## 2017-04-15 DIAGNOSIS — R3912 Poor urinary stream: Secondary | ICD-10-CM

## 2017-04-15 DIAGNOSIS — N323 Diverticulum of bladder: Secondary | ICD-10-CM

## 2017-04-15 DIAGNOSIS — R3916 Straining to void: Secondary | ICD-10-CM

## 2017-04-15 DIAGNOSIS — I10 Essential (primary) hypertension: Secondary | ICD-10-CM

## 2017-04-15 DIAGNOSIS — E1139 Type 2 diabetes mellitus with other diabetic ophthalmic complication: Secondary | ICD-10-CM

## 2017-04-15 DIAGNOSIS — Z8673 Personal history of transient ischemic attack (TIA), and cerebral infarction without residual deficits: Secondary | ICD-10-CM

## 2017-04-15 DIAGNOSIS — N401 Enlarged prostate with lower urinary tract symptoms: Secondary | ICD-10-CM | POA: Diagnosis not present

## 2017-04-15 DIAGNOSIS — Z8042 Family history of malignant neoplasm of prostate: Secondary | ICD-10-CM

## 2017-04-15 DIAGNOSIS — R338 Other retention of urine: Secondary | ICD-10-CM

## 2017-04-15 DIAGNOSIS — Z91120 Patient's intentional underdosing of medication regimen due to financial hardship: Secondary | ICD-10-CM

## 2017-04-15 DIAGNOSIS — H42 Glaucoma in diseases classified elsewhere: Secondary | ICD-10-CM | POA: Diagnosis not present

## 2017-04-15 DIAGNOSIS — Z960 Presence of urogenital implants: Secondary | ICD-10-CM

## 2017-04-15 DIAGNOSIS — R310 Gross hematuria: Secondary | ICD-10-CM | POA: Diagnosis not present

## 2017-04-15 DIAGNOSIS — N3289 Other specified disorders of bladder: Secondary | ICD-10-CM

## 2017-04-15 LAB — CBC
HCT: 44.6 % (ref 39.0–52.0)
Hemoglobin: 14.9 g/dL (ref 13.0–17.0)
MCH: 29.6 pg (ref 26.0–34.0)
MCHC: 33.4 g/dL (ref 30.0–36.0)
MCV: 88.5 fL (ref 78.0–100.0)
Platelets: 312 10*3/uL (ref 150–400)
RBC: 5.04 MIL/uL (ref 4.22–5.81)
RDW: 15.1 % (ref 11.5–15.5)
WBC: 12.4 10*3/uL — AB (ref 4.0–10.5)

## 2017-04-15 LAB — BASIC METABOLIC PANEL
ANION GAP: 9 (ref 5–15)
CALCIUM: 8.8 mg/dL — AB (ref 8.9–10.3)
CO2: 23 mmol/L (ref 22–32)
Chloride: 103 mmol/L (ref 101–111)
Creatinine, Ser: 1.01 mg/dL (ref 0.61–1.24)
GFR calc Af Amer: >60 mL/min (ref >60)
GLUCOSE: 111 mg/dL — AB (ref 65–99)
POTASSIUM: 3.8 mmol/L (ref 3.5–5.1)
SODIUM: 135 mmol/L (ref 135–145)

## 2017-04-15 LAB — GLUCOSE, CAPILLARY
GLUCOSE-CAPILLARY: 123 mg/dL — AB (ref 65–99)
GLUCOSE-CAPILLARY: 141 mg/dL — AB (ref 65–99)
Glucose-Capillary: 95 mg/dL (ref 65–99)

## 2017-04-15 LAB — URINE CULTURE: CULTURE: NO GROWTH

## 2017-04-15 LAB — HIV ANTIBODY (ROUTINE TESTING W REFLEX): HIV Screen 4th Generation wRfx: NONREACTIVE

## 2017-04-15 MED ORDER — MORPHINE SULFATE (PF) 2 MG/ML IV SOLN: 2.0000 mg | INTRAVENOUS | Status: DC | PRN

## 2017-04-15 MED ORDER — OXYCODONE-ACETAMINOPHEN 5-325 MG PO TABS: 1.0000 | ORAL_TABLET | Freq: Four times a day (QID) | ORAL | 0 refills | Status: DC | PRN

## 2017-04-15 MED ORDER — TAMSULOSIN HCL 0.4 MG PO CAPS
0.4000 mg | ORAL_CAPSULE | Freq: Every day | ORAL | 0 refills | Status: DC
Start: 2017-04-16 — End: 2017-04-15

## 2017-04-15 MED ORDER — INSULIN ASPART 100 UNIT/ML ~~LOC~~ SOLN
0.0000 [IU] | Freq: Three times a day (TID) | SUBCUTANEOUS | Status: DC
Start: 2017-04-15 — End: 2017-04-15
  Administered 2017-04-15: 2 [IU] via SUBCUTANEOUS

## 2017-04-15 MED ORDER — HYDRALAZINE HCL 20 MG/ML IJ SOLN
5.0000 mg | INTRAMUSCULAR | Status: DC | PRN
  Administered 2017-04-15: 5 mg via INTRAVENOUS
  Filled 2017-04-15: qty 1

## 2017-04-15 MED ORDER — AMLODIPINE BESYLATE 5 MG PO TABS: 5.0000 mg | ORAL_TABLET | Freq: Every day | ORAL | 0 refills | Status: AC

## 2017-04-15 MED ORDER — AMLODIPINE BESYLATE 5 MG PO TABS
5.0000 mg | ORAL_TABLET | Freq: Every day | ORAL | Status: DC
  Administered 2017-04-15: 5 mg via ORAL
  Filled 2017-04-15: qty 1

## 2017-04-15 MED ORDER — AMLODIPINE BESYLATE 10 MG PO TABS: 10.0000 mg | ORAL_TABLET | Freq: Every day | ORAL | Status: DC

## 2017-04-15 MED ORDER — AMLODIPINE BESYLATE 5 MG PO TABS: 5.0000 mg | ORAL_TABLET | Freq: Every day | ORAL | 0 refills | Status: DC

## 2017-04-15 MED ORDER — OXYCODONE-ACETAMINOPHEN 5-325 MG PO TABS: 1.0000 | ORAL_TABLET | Freq: Four times a day (QID) | ORAL | Status: DC | PRN

## 2017-04-15 MED ORDER — TAMSULOSIN HCL 0.4 MG PO CAPS: 0.4000 mg | ORAL_CAPSULE | Freq: Every day | ORAL | 0 refills | Status: AC

## 2017-04-15 MED ORDER — MORPHINE SULFATE (PF) 2 MG/ML IV SOLN
2.0000 mg | INTRAVENOUS | Status: DC | PRN
  Administered 2017-04-15 (×4): 2 mg via INTRAVENOUS
  Filled 2017-04-15 (×4): qty 1

## 2017-04-15 MED ORDER — LATANOPROST 0.005 % OP SOLN
1.0000 [drp] | Freq: Every day | OPHTHALMIC | Status: DC
  Filled 2017-04-15: qty 2.5

## 2017-04-15 NOTE — Progress Notes (Signed)
New Admission Note:  Arrival Method: Stretcher  Mental Orientation: Alert and oriented x 4 Telemetry: N/A Assessment: Completed Skin: Warm and dry  IV: NSL  Pain: 10/10 Tubes: Foley  Safety Measures: Safety Fall Prevention Plan initiated.  Admission: Completed 5 M  Orientation: Patient has been orientated to the room, unit and the staff. Family: None  Orders have been reviewed and implemented. Will continue to monitor the patient. Call light has been placed within reach and bed alarm has been activated.   [place] ShiEmmanuel Keoni Risinger BSN, RN  Phone Number: [phone]

## 2017-04-15 NOTE — Consult Note (Signed)
Urology Consult   Physician requesting consult: Chana Bode, MD  Reason for consult: Urinary retention  History of Present Illness: Paul Chase is a 62 y.o. who is currently being evaluated for acute urinary retention.  The patient states that he has a long history of BPH with lower urinary tract symptoms and has been on tamsulosin 0.4 mg once daily for the past several years.  He states that over the past 4 months he has had progressively worsening urinary urgency/frequency, weakening of his force of stream and the sensation of incomplete emptying.  He presented to the [facility] emergency department on 04/14/2017 with an inability to urinate, which prompted Foley catheterization.  He had approximately 1000 mL drained from his bladder following Foley catheterization.  Apparently there was some issues placing the catheter and he has had a mild amount of gross hematuria since then.  Currently, he is complaining of intermittent bladder spasms that he states are progressively improving.  Urinalysis from 04/14/2017 was negative for signs of a urinary tract infection.   He denies a history of  hematuria, UTIs, STDs, urolithiasis, GU malignancy/trauma/surgery.  Past Medical History:  Diagnosis Date  . BPH (benign prostatic hyperplasia)   . Diabetes mellitus without complication (HCC)    type 2  . Glaucoma   . Hyperlipidemia   . Hypertension   . Neuropathy   . Stroke [facility]) 2016    History reviewed. No pertinent surgical history.  Current Hospital Medications:  Home Meds:  Current Meds  Medication Sig  . aspirin EC 81 MG tablet Take 81 mg daily by mouth.  . tamsulosin (FLOMAX) 0.4 MG CAPS capsule Take 0.4 mg 2 (two) times daily by mouth.    Scheduled Meds: . amLODipine  5 mg Oral Daily  . insulin aspart  0-15 Units Subcutaneous TID WC  . latanoprost  1 drop Both Eyes QHS  . tamsulosin  0.4 mg Oral Daily   Continuous Infusions: PRN Meds:.acetaminophen **OR** acetaminophen,  morphine injection, senna-docusate  Allergies: No Known Allergies  History reviewed. No pertinent family history.  Social History:  reports that he has been smoking cigarettes.  He has been smoking about 1.00 pack per day. He has never used smokeless tobacco. He reports that he drinks alcohol. He reports that he has current or past drug history. Drug: Cocaine.  ROS: A complete review of systems was performed.  All systems are negative except for pertinent findings as noted.  Physical Exam:  Vital signs in last 24 hours: Temp:  [98.2 F (36.8 C)-98.6 F (37 C)] 98.2 F (36.8 C) (03/22 0839) Pulse Rate:  [60-106] 100 (03/22 0839) Resp:  [16-20] 18 (03/22 0839) BP: (103-213)/(74-101) 136/74 (03/22 0839) SpO2:  [97 %-100 %] 97 % (03/22 0839) Weight:  [90.7 kg (200 lb)] 90.7 kg (200 lb) (03/22 0107) Constitutional:  Alert and oriented, No acute distress Cardiovascular: Regular rate and rhythm, No JVD Respiratory: Normal respiratory effort, Lungs clear bilaterally GI: Abdomen is soft, nontender, nondistended, no abdominal masses GU: No CVA tenderness, 16 French Foley catheter in place and draining blood-tinged urine with no clots Lymphatic: No lymphadenopathy Neurologic: Grossly intact, no focal deficits Psychiatric: Normal mood and affect  Laboratory Data:  Recent Labs    04/14/17 1608 04/15/17 0635  WBC 8.4 12.4*  HGB 15.2 14.9  HCT 46.1 44.6  PLT 340 312    Recent Labs    04/14/17 1608 04/15/17 0635  NA 137 135  K 4.0 3.8  CL 102 103  GLUCOSE 91  111*  BUN 5* <5*  CALCIUM 9.1 8.8*  CREATININE 1.11 1.01     Results for orders placed or performed during the hospital encounter of 04/14/17 (from the past 24 hour(s))  Glucose, capillary     Status: Abnormal   Collection Time: 04/15/17  1:00 AM  Result Value Ref Range   Glucose-Capillary 123 (H) 65 - 99 mg/dL  HIV antibody (Routine Testing)     Status: None   Collection Time: 04/15/17  6:35 AM  Result Value Ref  Range   HIV Screen 4th Generation wRfx Non Reactive Non Reactive  CBC     Status: Abnormal   Collection Time: 04/15/17  6:35 AM  Result Value Ref Range   WBC 12.4 (H) 4.0 - 10.5 K/uL   RBC 5.04 4.22 - 5.81 MIL/uL   Hemoglobin 14.9 13.0 - 17.0 g/dL   HCT 16.1 09.6 - 04.5 %   MCV 88.5 78.0 - 100.0 fL   MCH 29.6 26.0 - 34.0 pg   MCHC 33.4 30.0 - 36.0 g/dL   RDW 40.9 81.1 - 91.4 %   Platelets 312 150 - 400 K/uL  Basic metabolic panel     Status: Abnormal   Collection Time: 04/15/17  6:35 AM  Result Value Ref Range   Sodium 135 135 - 145 mmol/L   Potassium 3.8 3.5 - 5.1 mmol/L   Chloride 103 101 - 111 mmol/L   CO2 23 22 - 32 mmol/L   Glucose, Bld 111 (H) 65 - 99 mg/dL   BUN <5 (L) 6 - 20 mg/dL   Creatinine, Ser 7.82 0.61 - 1.24 mg/dL   Calcium 8.8 (L) 8.9 - 10.3 mg/dL   GFR calc non Af Amer >60 >60 mL/min   GFR calc Af Amer >60 >60 mL/min   Anion gap 9 5 - 15  Glucose, capillary     Status: None   Collection Time: 04/15/17  7:22 AM  Result Value Ref Range   Glucose-Capillary 95 65 - 99 mg/dL  Glucose, capillary     Status: Abnormal   Collection Time: 04/15/17 11:39 AM  Result Value Ref Range   Glucose-Capillary 141 (H) 65 - 99 mg/dL   Recent Results (from the past 240 hour(s))  Urine culture     Status: None   Collection Time: 04/14/17  4:08 PM  Result Value Ref Range Status   Specimen Description URINE, RANDOM  Final   Special Requests NONE  Final   Culture   Final    NO GROWTH Performed at [facility], [address] [place], [place] [address]    Report Status 04/15/2017 FINAL  Final    Renal Function: Recent Labs    04/14/17 1608 04/15/17 0635  CREATININE 1.11 1.01   Estimated Creatinine Clearance: 84.6 mL/min (by C-G formula based on SCr of 1.01 mg/dL).  Radiologic Imaging: Ct Abdomen Pelvis W Contrast  Result Date: 04/14/2017 CLINICAL DATA:  Abdominal distension, hematuria, urinary retention and urgency for 1 week, rectal pressure, worsening  symptoms, history BPH, hypertension, diabetes mellitus EXAM: CT ABDOMEN AND PELVIS WITH CONTRAST TECHNIQUE: Multidetector CT imaging of the abdomen and pelvis was performed using the standard protocol following bolus administration of intravenous contrast. Sagittal and coronal MPR images reconstructed from axial data set. CONTRAST:  ISOVUE-300 IOPAMIDOL (ISOVUE-300) INJECTION 61% IV. No oral contrast. COMPARISON:  None FINDINGS: Lower chest: Minimal dependent atelectasis at lung bases Hepatobiliary: Gallbladder and liver normal appearance Pancreas: Normal appearance Spleen: Normal appearance.  Tiny splenule.  Adrenal/urinary tract: RIGHT adrenal gland normal. Tiny nonspecific LEFT adrenal nodule 12 x 7 mm image 18. Kidneys normal appearance without mass or hydronephrosis. No urinary tract calcification. Upper normal caliber ureters. Foley catheter within urinary bladder. Marked diffuse bladder wall thickening up to 2.3 cm thick. This could represent severe cystitis but tumor is not excluded with this appearance. Suspect small RIGHT lateral bladder diverticulum. Stomach/Bowel: Appendix not visualized but no pericecal inflammatory process is seen. Stomach and bowel loops otherwise normal appearance Vascular/Lymphatic: Atherosclerotic calcifications aorta and iliac arteries without aneurysm. No adenopathy. Reproductive: Prostatic enlargement, gland measuring 6.3 x 4.6 x 4.5 cm. Other: No free air or free fluid. Small umbilical hernia containing fat. Musculoskeletal: Degenerative disc disease changes L5-S1. IMPRESSION: Marked diffuse bladder wall thickening question cystitis versus tumor; correlation with cystoscopy recommended. 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 Tiny nonspecific LEFT adrenal nodule 12 x 7 mm. Prostatic enlargement. Small umbilical hernia containing fat. Electronically Signed   By: Ulyses SouthwardMark  Boles M.D.   On: 04/14/2017 20:28    I independently reviewed the above imaging  studies.  Impression/Recommendation Urinary retention Gross hematuria BPH w/ LUTS (frequency, urgency and weak force of stream)  -I recommend keeping his Foley catheter in place and continuing tamsulosin once daily.  He can follow-up in our office in 1 week for a voiding trial and flexible cystoscopy.  Paul Moodyhristopher Lyncoln Ledgerwood, MD [facility] 04/15/2017, 4:13 PM

## 2017-04-15 NOTE — Care Management CC44 (Signed)
Condition Code 44 Documentation Completed  Patient Details  Name: Paul Chase MRN: [id number] Date of Birth: 08/30/1955   Condition Code 44 given:  Yes Patient signature on Condition Code 44 notice:  Yes Documentation of 2 MD's agreement:    Code 44 added to claim:  Yes    Paul FlemingsKimberly R Khiley Lieser, RN 04/15/2017, 2:59 PM

## 2017-04-15 NOTE — Progress Notes (Signed)
   Subjective:  Patient seen and examined. He states he has been having severe bladder spasms since the foley catheter has been inserted, pain severity worse than 10/10. Is very concerned about the blood in his urine. Discussed the appearance of the bladder on CT scan and possibility of malignancy. Also discussed the plan to get urology consult. He is agreeable to this plan.   Objective:  Vital signs in last 24 hours: Vitals:   04/14/17 2300 04/15/17 0107 04/15/17 0518 04/15/17 0839  BP: (!) 186/89 (!) 194/90 103/80 136/74  Pulse: 77 90 (!) 106 100  Resp:  20 19 18   Temp:  98.6 F (37 C) 98.5 F (36.9 C) 98.2 F (36.8 C)  TempSrc:  Oral Oral Oral  SpO2: 99% 100% 100% 97%  Weight:  200 lb (90.7 kg)    Height:  6\' 1"  (1.854 m)     General: Laying in bed comfortably, NAD HEENT: [place]/AT, no scleral icterus,  Cardiac: RRR, No R/M/G appreciated Pulm: normal effort, CTAB Abd: soft, non tender, non distended, BS normal Ext: extremities well perfused, no peripheral edema Neuro: alert and oriented X3, cranial nerves II-XII grossly intact   Assessment/Plan:  Active Problems:   Urinary retention  Acute on Chronic Urinary Retention, History of BPH Patient presenting with acute worsening of BPH symptoms. Bladder scan in ED showed urinary retention of 1000c. CT images show diffuse bladder wall thickening and right sided bladder diverticulum. Given clinical history and CT findings likley a result of the patient's ongoing obstruction from his enlarged prostate. Creatinine at baseline 1.1, no signs of infection on UA, no hydro seen on CT.  Nevertheless, the patient has a strong family history of cancer including prostate, and is an active smoker, with a newly traumatic foley catheter placement,  further evaluation by urology is warranted.   -Urology consulted, appreciate recs - will likely need outpatient follow up  -Continue indwelling foley  -Pain control with morphine 2mg  Q3h PRN -Continue  flomax   HTN Currently normotensive, BP 136/74.  -Continue Amlodipine 5 mg   T2DM No recent hemoglobin A1C. Not on any home medications, was told her was "borderline" diabetic. BG meeting inpatient goals.  -CBG monitoring TID with meals and qhs -SSI    Dispo: Anticipated discharge in approximately 0-1 day(s) pending urology recs.   Toney RakesLacroce, Orilla Templeman J, MD 04/15/2017, 1:23 PM Pager: [phone]

## 2017-04-15 NOTE — Care Management Obs Status (Signed)
MEDICARE OBSERVATION STATUS NOTIFICATION   Patient Details  Name: Paul Chase MRN: [id number] Date of Birth: 02/24/1955   Medicare Observation Status Notification Given:  Yes  Yancey FlemingsKimberly R Becton, RN 04/15/2017, 2:59 PM

## 2017-04-15 NOTE — Progress Notes (Signed)
Report received. Room ready.  

## 2017-04-15 NOTE — H&P (Signed)
Date: 04/15/2017               [name] MRN: [id number]  DOB: 1955/08/26 Age / Sex: 62 y.o., male   PCP: Paul Grams, MD         Medical Service: Internal Medicine Teaching Service         Attending Physician: Dr. Sandre Kitty Elwin Mocha, MD    First Contact: Dr. Juluis Chase Pager: [phone]  Second Contact: Dr. Noemi Chase Pager: [phone]       After Hours (After 5p/  First Contact Pager: [phone]  weekends / holidays): Second Contact Pager: [phone]   Chief Complaint: urinary retention   History of Present Illness: Patient is a 62 year old male with past medical history of BPH, type 2 diabetes mellitus, glaucoma, hypertension, hyperlipidemia, CVA.  He presents with worsening BPH symptoms beginning a few months ago with her highest intensity this week.  He reports intense straining while trying to urinate, and increased frequency and very weak stream.  He also feels the sensation to defecate during this and has had to on several occasions.   He denies any lower abdominal pain with the symptoms.  He has never noticed any blood or abnormal color to the urine.  He has not had any fevers, or chills.  He denies any nausea or vomiting, shortness of breath or chest pain. He is not taking any of his medications to treat his chronic medical conditions for the last 3 months due to lack of funds.  He moved here from [place] a few months ago and has been having trouble affording a place to stay.  He has noticed a 20 pound weight loss over the last 3-4 months or so.    ED course: pt arrived slightly hypertensive but with otherwise normal vitals.  His CMP and CBC were unremarkable, he was bladder scanned that showed approximately 1000cc confirmed by bedside US.  His UA was unremarkable including no blood.  An in and out cath was performed drained a liter of clear fluid followed by bloody fluid.  It was irrigated and clots returned.  His BP also began to rise and he was given a CT  abdomen pelvis which showed diffuse bladder wall thickening concerning for cystitis versus bladder tumor.  Urology was consulted and will see pt in am, he was started on rocephin as well.      Meds:  Current Meds  Medication Sig  . aspirin EC 81 MG tablet Take 81 mg daily by mouth.  . tamsulosin (FLOMAX) 0.4 MG CAPS capsule Take 0.4 mg 2 (two) times daily by mouth.     Allergies: Allergies as of 04/14/2017  . (No Known Allergies)   Past Medical History:  Diagnosis Date  . BPH (benign prostatic hyperplasia)   . Diabetes mellitus without complication (HCC)    type 2  . Glaucoma   . Hyperlipidemia   . Hypertension   . Neuropathy   . Stroke [facility]) 2016    Family History:  Extensive family history of cancer: Prostate cancer in uncle Pancreatic cancer in aunt Mother lymphoma Sister Breast cancer  Father heart disease   Social History:  Social History   Socioeconomic History  . Marital status: Married    Spouse name: Not on file  . Number of children: Not on file  . Years of education: Not on file  . Highest education level: Not on file  Occupational History  . Not on file  Social Needs  . Financial resource strain: Not on file  . Food insecurity:    Worry: Not on file    Inability: Not on file  . Transportation needs:    Medical: Not on file    Non-medical: Not on file  Tobacco Use  . Smoking status: Current Every Day Smoker    Packs/day: 1.00    Types: Cigarettes  . Smokeless tobacco: Never Used  Substance and Sexual Activity  . Alcohol use: Yes    Comment: rare  . Drug use: Yes    Types: Cocaine    Comment: "as much as I can"  . Sexual activity: Not on file  Lifestyle  . Physical activity:    Days per week: Not on file    Minutes per session: Not on file  . Stress: Not on file  Relationships  . Social connections:    Talks on phone: Not on file    Gets together: Not on file    Attends religious service: Not on file    Active member of club or  organization: Not on file    Attends meetings of clubs or organizations: Not on file    Relationship status: Not on file  . Intimate partner violence:    Fear of current or ex partner: Not on file    Emotionally abused: Not on file    Physically abused: Not on file    Forced sexual activity: Not on file  Other Topics Concern  . Not on file  Social History Narrative  . Not on file   Smoked for 25 years 1/2-1ppd Drinks about 1 beer per week Last drink a few weeks ago  Review of Systems: A complete ROS was negative except as per HPI.   Physical Exam: Blood pressure (!) 186/89, pulse 77, temperature 98.2 F (36.8 C), temperature source Oral, resp. rate 16, SpO2 99 %. Physical Exam  Constitutional: He is oriented to person, place, and time. He appears well-developed and well-nourished.  HENT:  Head: Normocephalic and atraumatic.  Eyes: Right eye exhibits no discharge. Left eye exhibits no discharge. No scleral icterus.  Cardiovascular: Normal rate, regular rhythm, normal heart sounds and intact distal pulses. Exam reveals no gallop and no friction rub.  No murmur heard. Pulmonary/Chest: Effort normal and breath sounds normal. No respiratory distress. He has no wheezes. He has no rales.  Abdominal: Soft. Bowel sounds are normal. He exhibits no distension and no mass. There is no tenderness. There is no guarding.  Genitourinary: Penile tenderness present.  Neurological: He is alert and oriented to person, place, and time.  Psychiatric: He has a normal mood and affect.    EKG: personally reviewed my interpretation is none available  CXR: personally reviewed my interpretation is none available  Assessment & Plan by Problem: Active Problems:   Urinary retention  BPH w/urinary retention: pt with worsening BPH, CT images show diffuse bladder wall thickening and right sided bladder diverticulum.  Given clinical history and CT findings I tend to favor this bladder wall thickening and  diverticulum is a result of the patient's ongoing obstruction from his enlarged prostate.  Nevertheless, the patient has a strong family history of cancer including prostate, and is an active smoker, with a newly traumatic foley catheter placement,  further evaluation by urology is warranted.    -continue indwelling foley for now -pain control with morphine 2mg  Q3h PRN -urology will see in am  HTN: Pt was really only mildly hypertensive when he arrived.  However, after his traumatic foley he has been very hypertensive.  -will focus on pain control -PRN hydralazine 5mg  ordered -amlodipine 10mg  in am  T2DM: patient reports not taking any meds for this recently but said he was told he was borderline diabetic  -will monitor for now  Dispo: Admit patient to Observation with expected length of stay less than 2 midnights.  Signed: Angelita Ingles, MD 04/15/2017, 12:22 AM

## 2017-04-15 NOTE — Progress Notes (Signed)
Patient discharged to home. IV removed, discharge instructions reviewed. Scripts given. Pt educated and converted to foley leg bag. Pt to follow up with urology in one week.   Jaynee EaglesLaura Wyn Nettle, RN

## 2017-04-15 NOTE — Clinical Social Work Note (Signed)
Patient has discharged and was provided with a bus pass, at his request to get home.  Genelle BalVanessa Maisen Schmit, MSW, LCSW Licensed Clinical Social Worker Clinical Social Work Department [facility] [phone]

## 2017-04-15 NOTE — Care Management Note (Addendum)
Case Management Note  Patient Details  Name: Paul Chase MRN: [id number] Date of Birth: 07/19/1955  Subjective/Objective:  History of BPH admitted for   Urinary retention likely from BPH. strong family history of cancer including prostate, and is an active smoker, with a newly traumatic foley catheter placement  Action/Plan: Urology consulted.  NCM Paul continue to monitor for discharge transition needs.  Expected Discharge Date:  04/18/17               Expected Discharge Plan:  Home/Self Care  Discharge planning Services  CM Consult  Status of Service:  In process, Paul continue to follow  Yancey FlemingsKimberly R Becton, RN  Nurse case manager [place] 04/15/2017, 11:28 AM

## 2017-04-19 NOTE — Discharge Summary (Signed)
Name: Paul Chase MRN: [id number] DOB: Jun 12, 1955 62 y.o. PCP: Veneda Melter, MD  Date of Admission: 04/14/2017  2:24 PM Date of Discharge: 04/15/2017 Attending Physician: No att. providers found  Discharge Diagnosis: 1. Acute Urinary Retention Principal Problem:   Urinary retention Active Problems:   Gross hematuria   Discharge Medications: Allergies as of 04/15/2017   No Known Allergies     Medication List    TAKE these medications   amLODipine 5 MG tablet Commonly known as:  NORVASC Take 1 tablet (5 mg total) by mouth daily.   aspirin EC 81 MG tablet Take 81 mg daily by mouth.   bisoprolol-hydrochlorothiazide 5-6.25 MG tablet Commonly known as:  ZIAC Take 1 tablet daily by mouth.   gabapentin 300 MG capsule Commonly known as:  NEURONTIN Take 300 mg 2 (two) times daily by mouth.   glimepiride 4 MG tablet Commonly known as:  AMARYL Take 4 mg daily with breakfast by mouth.   oxyCODONE-acetaminophen 5-325 MG tablet Commonly known as:  PERCOCET Take 1 tablet by mouth every 6 (six) hours as needed for severe pain.   simvastatin 40 MG tablet Commonly known as:  ZOCOR Take 40 mg at bedtime by mouth.   tamsulosin 0.4 MG Caps capsule Commonly known as:  FLOMAX Take 1 capsule (0.4 mg total) by mouth daily. What changed:  when to take this   travoprost (benzalkonium) 0.004 % ophthalmic solution Commonly known as:  TRAVATAN Place 1 drop at bedtime into both eyes.       Disposition and follow-up:   Mr.Paul Chase was discharged from [facility] in Stable condition.  At the hospital follow up visit please address:  1. Acute Urinary Retention -UA negative for signs of infection, Renal function stable -Patient was discharged with foley catheter and planned follow up with Urology within 1 week of discharge for void trial  -CT scan of abdomen and pelvis revealed diffuse thickening of bladder wall, malignancy could not be excluded, patient  to follow up with Urology for cystoscopy  -Please address if the patient has followed up with Urology for void trial and cystoscopy, and is what their recommendations are -Patient experienced hematuria following traumatic foley insertion, has hematuria resolved? Has pain resolved? -Flomax 0.4 mg daily was continued and prescribed at discharge -Patient was also given 5 day course of Percocet for pain associated with catheter  Hypertension -Patient was normotensive on arrival and developed HTN after traumatic foley insertion, likely secondary to acute pain.  -Bisprolol-HCTZ was held during hospitalization, but resumed at discharge -He was discharged on 5 mg of Amlodipine daily -Please address the patient's blood pressure regimen and if he is taking these medications -Please evaluate patient's blood pressure and need for escalation or de-escalation in BP meds     Type 2 Diabetes Melllitus  -No recent hemoglobin A1C.  -Not on any home medications, was told her was "borderline" diabetic.  -Blood glucose met inpatient goals while hospitalized -Please screen patient for type 2 diabetes mellitus with A1C   2.  Labs / imaging needed at time of follow-up: none   3.  Pending labs/ test needing follow-up: none   Follow-up Appointments: Follow-up Information    Veneda Melter, MD. Call.   Specialty:  Cardiology Contact information: [place] [address] [phone]        Ceasar Mons, MD In 1 week.   Specialty:  Urology Why:  Foley catheter removal and cystoscopy Contact information: [address] [place] [address] [phone]           Hospital Course by problem list: Principal Problem:   Urinary retention Active Problems:   Gross hematuria   1. Acute on Chronic Urinary Retention, History of BPH Mr. Paul Chase was admitted to [facility] and the Internal Medicine teaching program for acute urinary  retention. He presented to [facility] with acute worsening of BPH symptoms. Bladder scan in ED revealed urinary retention of 1000 mL. CT scan of the abdomen and pelvis showed diffuse bladder wall thickening and right sided bladder diverticulum, malignancy not excluded, correlation with cystoscopy recommended. He had no hydronephrosis and his creatinine was at his baseline. His urine analysis showed no signs of infection on UA. Foley catheter as placed. The placement was traumatic and the patient developed gross hematuria with clots. Urology was consulted and recommended continuing flomax daily and to continue with foley catheter placement until follow up with Urology as outpatient in 1 week. Urology also plans to do cystoscopy.   Hypertension Patient was normotensive on arrival and developed HTN after traumatic foley insertion, likely secondary to acute pain. Bisprolol-HCTZ was held during hospitalization, but resumed at discharge. He was also prescribed 5 mg of amlodipine daily at discharge.   Type 2 Diabetes Melllitus  No recent hemoglobin A1C. Not on any home medications, was told her was "borderline" diabetic. Blood glucose met inpatient goals while hospitalized. Please screen patient for type 2 diabetes mellitus with Hemoglobin A1C.  Discharge Vitals:   BP 136/77 (BP Location: Left Arm)   Pulse (!) 106   Temp 98.4 F (36.9 C) (Oral)   Resp 18   Ht '6\' 1"'$  (1.854 m)   Wt 200 lb (90.7 kg)   SpO2 98%   BMI 26.39 kg/m   Pertinent Labs, Studies, and Procedures:  CBC Latest Ref Rng & Units 04/15/2017 04/14/2017 12/03/2016  WBC 4.0 - 10.5 K/uL 12.4(H) 8.4 11.2(H)  Hemoglobin 13.0 - 17.0 g/dL 14.9 15.2 15.6  Hematocrit 39.0 - 52.0 % 44.6 46.1 46.6  Platelets 150 - 400 K/uL 312 340 325   Urinalysis    Component Value Date/Time   COLORURINE STRAW (A) 04/14/2017 1150   APPEARANCEUR CLEAR 04/14/2017 1150   LABSPEC 1.005 04/14/2017 1150   PHURINE 6.0 04/14/2017 1150   GLUCOSEU NEGATIVE 04/14/2017 1150    HGBUR NEGATIVE 04/14/2017 1150   BILIRUBINUR NEGATIVE 04/14/2017 1150   KETONESUR NEGATIVE 04/14/2017 1150   PROTEINUR NEGATIVE 04/14/2017 1150   NITRITE NEGATIVE 04/14/2017 1150   LEUKOCYTESUR NEGATIVE 04/14/2017 1150   BMP Latest Ref Rng & Units 04/15/2017 04/14/2017 12/03/2016  Glucose 65 - 99 mg/dL 111(H) 91 86  BUN 6 - 20 mg/dL <5(L) 5(L) 9  Creatinine 0.61 - 1.24 mg/dL 1.01 1.11 1.29(H)  Sodium 135 - 145 mmol/L 135 137 137  Potassium 3.5 - 5.1 mmol/L 3.8 4.0 3.5  Chloride 101 - 111 mmol/L 103 102 101  CO2 22 - 32 mmol/L '23 28 26  '$ Calcium 8.9 - 10.3 mg/dL 8.8(L) 9.1 9.3   CT scan of Abdomen and Pelvis IMPRESSION: Marked diffuse bladder wall thickening question cystitis versus tumor; correlation with cystoscopy recommended.  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  Tiny nonspecific LEFT adrenal nodule 12 x 7 mm.  Prostatic enlargement.  Small umbilical hernia containing fat.  Discharge Instructions: Discharge Instructions    Call MD for:  temperature >100.4   Complete by:  As directed    Diet - low sodium heart  healthy   Complete by:  As directed    Discharge instructions   Complete by:  As directed    Mr. Danis,   Your bladder was not emptying properly, this is called acute urinary retention, and required bladder decompression with a catheter. You had no damage to your kidneys and had no signs of infection in your urine.   The urologist recommends keeping the foley catheter in for 1 week duration. You Paul follow up in the urology clinic in 1 week to remove the foley and see if your bladder is emptying normally. They Paul also use a camera to look into your bladder.   I have prescribed you a short course of Percocet for your pain. Please take 1 tablet every 6 hours for severe bladder pain. I have sent this prescription to your pharmacy.   I have also sent Amlodipine 5 mg and Tamsulosin 0.4 mg daily to your pharmacy.   Increase activity slowly    Complete by:  As directed       Signed: Melanee Spry, MD 04/19/2017, 3:14 PM   Pager: [phone]

## 2017-06-07 ENCOUNTER — Other Ambulatory Visit: Payer: Self-pay | Admitting: Urology

## 2017-06-10 NOTE — Patient Instructions (Addendum)
Will Durkin  06/10/2017   Your procedure is scheduled on: 06-17-17   Report to [facility] Main  Entrance             Report to admitting at     1015 AM    Call this number if you have problems the morning of surgery [phone]    Remember: Do not eat food or drink liquids :After Midnight.     Take these medicines the morning of surgery with A SIP OF WATER: flomax, gabapentin             DO NOT TAKE ANY DIABETIC MEDICATIONS DAY OF YOUR SURGERY                               You may not have any metal on your body including hair pins and             piercings  Do not wear jewelry,                                                                       lotions, powders or perfumes, deodorant         .              Men may shave face and neck.   Do not bring valuables to the hospital. [place] IS NOT             RESPONSIBLE   FOR VALUABLES.  Contacts, dentures or bridgework may not be worn into surgery.      Patients discharged the day of surgery will not be allowed to drive home.  Name and phone number of your driver:  Special Instructions: N/A              Please read over the following fact sheets you were given: _____________________________________________________________________           [facility] - Preparing for Surgery Before surgery, you can play an important role.  Because skin is not sterile, your skin needs to be as free of germs as possible.  You can reduce the number of germs on your skin by washing with CHG (chlorahexidine gluconate) soap before surgery.  CHG is an antiseptic cleaner which kills germs and bonds with the skin to continue killing germs even after washing. Please DO NOT use if you have an allergy to CHG or antibacterial soaps.  If your skin becomes reddened/irritated stop using the CHG and inform your nurse when you arrive at Short Stay. Do not shave (including legs and underarms) for at least 48 hours prior to the first  CHG shower.  You may shave your face/neck. Please follow these instructions carefully:  1.  Shower with CHG Soap the night before surgery and the  morning of Surgery.  2.  If you choose to wash your hair, wash your hair first as usual with your  normal  shampoo.  3.  After you shampoo, rinse your hair and body thoroughly to remove the  shampoo.  4.  Use CHG as you would any other liquid soap.  You can apply chg directly  to the skin and wash                       Gently with a scrungie or clean washcloth.  5.  Apply the CHG Soap to your body ONLY FROM THE NECK DOWN.   Do not use on face/ open                           Wound or open sores. Avoid contact with eyes, ears mouth and genitals (private parts).                       Wash face,  Genitals (private parts) with your normal soap.             6.  Wash thoroughly, paying special attention to the area where your surgery  will be performed.  7.  Thoroughly rinse your body with warm water from the neck down.  8.  DO NOT shower/wash with your normal soap after using and rinsing off  the CHG Soap.                9.  Pat yourself dry with a clean towel.            10.  Wear clean pajamas.            11.  Place clean sheets on your bed the night of your first shower and do not  sleep with pets. Day of Surgery : Do not apply any lotions/deodorants the morning of surgery.  Please wear clean clothes to the hospital/surgery center.  FAILURE TO FOLLOW THESE INSTRUCTIONS MAY RESULT IN THE CANCELLATION OF YOUR SURGERY PATIENT SIGNATURE_________________________________  NURSE SIGNATURE__________________________________  ________________________________________________________________________

## 2017-06-13 ENCOUNTER — Encounter (HOSPITAL_COMMUNITY)
Admission: RE | Admit: 2017-06-13 | Discharge: 2017-06-13 | Disposition: A | Discharge status: Home / Self Care | Payer: Medicare Other | Source: Ambulatory Visit | Attending: Urology | Admitting: Urology

## 2017-06-13 ENCOUNTER — Encounter (HOSPITAL_COMMUNITY): Payer: Self-pay

## 2017-06-13 ENCOUNTER — Other Ambulatory Visit: Payer: Self-pay

## 2017-06-13 DIAGNOSIS — Z01810 Encounter for preprocedural cardiovascular examination: Secondary | ICD-10-CM | POA: Diagnosis not present

## 2017-06-13 DIAGNOSIS — R339 Retention of urine, unspecified: Secondary | ICD-10-CM | POA: Insufficient documentation

## 2017-06-13 DIAGNOSIS — Z01812 Encounter for preprocedural laboratory examination: Secondary | ICD-10-CM | POA: Insufficient documentation

## 2017-06-13 HISTORY — DX: Presence of urogenital implants: Z96.0

## 2017-06-13 HISTORY — DX: Presence of other specified devices: Z97.8

## 2017-06-13 LAB — BASIC METABOLIC PANEL
Anion gap: 8 (ref 5–15)
BUN: 19 mg/dL (ref 6–20)
CHLORIDE: 105 mmol/L (ref 101–111)
CO2: 26 mmol/L (ref 22–32)
CREATININE: 1.59 mg/dL — AB (ref 0.61–1.24)
Calcium: 9.3 mg/dL (ref 8.9–10.3)
GFR calc non Af Amer: 45 mL/min — ABNORMAL LOW (ref >60)
GFR, EST AFRICAN AMERICAN: 52 mL/min — AB (ref >60)
Glucose, Bld: 93 mg/dL (ref 65–99)
Potassium: 4.3 mmol/L (ref 3.5–5.1)
Sodium: 139 mmol/L (ref 135–145)

## 2017-06-13 LAB — HEMOGLOBIN A1C
Hgb A1c MFr Bld: 6.2 % — ABNORMAL HIGH (ref 4.8–5.6)
Mean Plasma Glucose: 131.24 mg/dL

## 2017-06-13 LAB — CBC
HCT: 43.3 % (ref 39.0–52.0)
HEMOGLOBIN: 14.5 g/dL (ref 13.0–17.0)
MCH: 29.9 pg (ref 26.0–34.0)
MCHC: 33.5 g/dL (ref 30.0–36.0)
MCV: 89.3 fL (ref 78.0–100.0)
PLATELETS: 346 10*3/uL (ref 150–400)
RBC: 4.85 MIL/uL (ref 4.22–5.81)
RDW: 14.2 % (ref 11.5–15.5)
WBC: 8.7 10*3/uL (ref 4.0–10.5)

## 2017-06-13 LAB — GLUCOSE, CAPILLARY
GLUCOSE-CAPILLARY: 98 mg/dL (ref 65–99)
Glucose-Capillary: 63 mg/dL — ABNORMAL LOW (ref 65–99)

## 2017-06-14 NOTE — Progress Notes (Signed)
Final EKG in epic 

## 2017-06-16 NOTE — H&P (Signed)
Urology Preoperative H&P   Chief Complaint: Urinary retention  History of Present Illness: Paul Chase is a 62 y.o. male with a history of BPH/LUTS (currently on tamsulosin BID).  He has a history of urinary retention and was seen on 06/06/17 and was found to have a PVR of 800 mL and a possible UTI.  His urine culture grew multiple Staph species sensitive to cipro. After multiple unsuccessful attempts to place a Foley catheter, he eventually had a catheter placed cystoscopically.  His cysto revealed tri-lobar prostatic urethral obstruction with a prominent median lobe. He is here today for cystoscopy and TURP.   Past Medical History:  Diagnosis Date  . BPH (benign prostatic hyperplasia)   . Diabetes mellitus without complication (HCC)    type 2  . Foley catheter in place    due to BPH  . Glaucoma   . Hyperlipidemia   . Hypertension   . Neuropathy    pt. denies  . Stroke [facility]) 2016   minor left side mild weakness    Past Surgical History:  Procedure Laterality Date  . APPENDECTOMY    . EYE SURGERY     r eye cataract    Allergies: No Known Allergies  No family history on file.  Social History:  reports that he has been smoking cigarettes.  He has been smoking about 1.00 pack per day. He has never used smokeless tobacco. He reports that he drinks alcohol. He reports that he has current or past drug history. Drug: Cocaine.  ROS: A complete review of systems was performed.  All systems are negative except for pertinent findings as noted.  Physical Exam:  Vital signs in last 24 hours:   Constitutional:  Alert and oriented, No acute distress Cardiovascular: Regular rate and rhythm, No JVD Respiratory: Normal respiratory effort, Lungs clear bilaterally GI: Abdomen is soft, nontender, nondistended, no abdominal masses GU: No CVA tenderness, Foley in place and draining yellow urine Lymphatic: No lymphadenopathy Neurologic: Grossly intact, no focal deficits Psychiatric: Normal  mood and affect  Laboratory Data:  No results for input(s): WBC, HGB, HCT, PLT in the last 72 hours.  No results for input(s): NA, K, CL, GLUCOSE, BUN, CALCIUM, CREATININE in the last 72 hours.  Invalid input(s): CO3   No results found for this or any previous visit (from the past 24 hour(s)). No results found for this or any previous visit (from the past 240 hour(s)).  Renal Function: Recent Labs    06/13/17 1054  CREATININE 1.59*   Estimated Creatinine Clearance: 53.7 mL/min (A) (by C-G formula based on SCr of 1.59 mg/dL (H)).  Radiologic Imaging: No results found.  I independently reviewed the above imaging studies.  Assessment and Plan Paul Emma is a 62 y.o. male with BPH and urinary retention  -The risks, benefits and alternatives of cystoscopy with TURP was discussed with the patient. The risks included, but are not limited to, bleeding, urinary tract infection, bladder perforation requiring prolonged catheterization and/or open bladder repair, ureteral injury, ureteral obstruction, new or worsening voiding dysfunction, retrograde ejaculation, erectile dysfunction, MI, CVA, PE and the inherent risks of general anesthesia. We also discussed the need for Foley catheterization for at least 3 days post-op and the likely need for post-op observation in the hospital following the procedure. The patient voices understanding and wishes to proceed.  Rhoderick Moody, MD 06/16/2017, 5:36 PM  [facility] Pager: [phone]

## 2017-06-17 ENCOUNTER — Other Ambulatory Visit: Payer: Self-pay

## 2017-06-17 ENCOUNTER — Ambulatory Visit (HOSPITAL_COMMUNITY): Payer: Medicare Other | Admitting: Certified Registered Nurse Anesthetist

## 2017-06-17 ENCOUNTER — Observation Stay (HOSPITAL_COMMUNITY)
Admission: RE | Admit: 2017-06-17 | Discharge: 2017-06-18 | Disposition: A | Discharge status: Home / Self Care | Payer: Medicare Other | Source: Ambulatory Visit | Attending: Urology | Admitting: Urology

## 2017-06-17 ENCOUNTER — Encounter (HOSPITAL_COMMUNITY)
Admission: RE | Disposition: A | Discharge status: Home / Self Care | Payer: Self-pay | Source: Ambulatory Visit | Attending: Urology

## 2017-06-17 ENCOUNTER — Encounter (HOSPITAL_COMMUNITY): Payer: Self-pay

## 2017-06-17 DIAGNOSIS — I69354 Hemiplegia and hemiparesis following cerebral infarction affecting left non-dominant side: Secondary | ICD-10-CM | POA: Insufficient documentation

## 2017-06-17 DIAGNOSIS — R339 Retention of urine, unspecified: Secondary | ICD-10-CM | POA: Diagnosis present

## 2017-06-17 DIAGNOSIS — N323 Diverticulum of bladder: Secondary | ICD-10-CM | POA: Insufficient documentation

## 2017-06-17 DIAGNOSIS — R351 Nocturia: Secondary | ICD-10-CM | POA: Diagnosis not present

## 2017-06-17 DIAGNOSIS — Z79899 Other long term (current) drug therapy: Secondary | ICD-10-CM | POA: Insufficient documentation

## 2017-06-17 DIAGNOSIS — Z7982 Long term (current) use of aspirin: Secondary | ICD-10-CM | POA: Insufficient documentation

## 2017-06-17 DIAGNOSIS — E119 Type 2 diabetes mellitus without complications: Secondary | ICD-10-CM | POA: Insufficient documentation

## 2017-06-17 DIAGNOSIS — R338 Other retention of urine: Secondary | ICD-10-CM | POA: Diagnosis not present

## 2017-06-17 DIAGNOSIS — I10 Essential (primary) hypertension: Secondary | ICD-10-CM | POA: Insufficient documentation

## 2017-06-17 DIAGNOSIS — N138 Other obstructive and reflux uropathy: Secondary | ICD-10-CM | POA: Diagnosis not present

## 2017-06-17 DIAGNOSIS — F17210 Nicotine dependence, cigarettes, uncomplicated: Secondary | ICD-10-CM | POA: Insufficient documentation

## 2017-06-17 DIAGNOSIS — N401 Enlarged prostate with lower urinary tract symptoms: Secondary | ICD-10-CM | POA: Diagnosis not present

## 2017-06-17 DIAGNOSIS — Z7984 Long term (current) use of oral hypoglycemic drugs: Secondary | ICD-10-CM | POA: Insufficient documentation

## 2017-06-17 HISTORY — PX: TRANSURETHRAL RESECTION OF PROSTATE: SHX73

## 2017-06-17 LAB — GLUCOSE, CAPILLARY
GLUCOSE-CAPILLARY: 82 mg/dL (ref 65–99)
Glucose-Capillary: 116 mg/dL — ABNORMAL HIGH (ref 65–99)

## 2017-06-17 LAB — RAPID URINE DRUG SCREEN, HOSP PERFORMED
AMPHETAMINES: NOT DETECTED
BENZODIAZEPINES: NOT DETECTED
Barbiturates: NOT DETECTED
Cocaine: NOT DETECTED
Opiates: NOT DETECTED
TETRAHYDROCANNABINOL: NOT DETECTED

## 2017-06-17 LAB — HEMOGLOBIN AND HEMATOCRIT, BLOOD
HCT: 38.9 % — ABNORMAL LOW (ref 39.0–52.0)
Hemoglobin: 12.6 g/dL — ABNORMAL LOW (ref 13.0–17.0)

## 2017-06-17 SURGERY — TURP (TRANSURETHRAL RESECTION OF PROSTATE)
Anesthesia: General | Site: Prostate

## 2017-06-17 MED ORDER — ONDANSETRON HCL 4 MG/2ML IJ SOLN
INTRAMUSCULAR | Status: AC
  Filled 2017-06-17: qty 2

## 2017-06-17 MED ORDER — FENTANYL CITRATE (PF) 100 MCG/2ML IJ SOLN
INTRAMUSCULAR | Status: AC
  Filled 2017-06-17: qty 2

## 2017-06-17 MED ORDER — ONDANSETRON HCL 4 MG/2ML IJ SOLN: 4.0000 mg | INTRAMUSCULAR | Status: DC | PRN

## 2017-06-17 MED ORDER — ONDANSETRON HCL 4 MG PO TABS: 4.0000 mg | ORAL_TABLET | Freq: Every day | ORAL | 1 refills | Status: AC | PRN

## 2017-06-17 MED ORDER — MIDAZOLAM HCL 5 MG/5ML IJ SOLN
INTRAMUSCULAR | Status: DC | PRN
  Administered 2017-06-17: 2 mg via INTRAVENOUS

## 2017-06-17 MED ORDER — HYDROCODONE-ACETAMINOPHEN 5-325 MG PO TABS
1.0000 | ORAL_TABLET | ORAL | Status: DC | PRN
  Administered 2017-06-18: 1 via ORAL
  Filled 2017-06-17: qty 1

## 2017-06-17 MED ORDER — ACETAMINOPHEN 325 MG PO TABS: 650.0000 mg | ORAL_TABLET | ORAL | Status: DC | PRN

## 2017-06-17 MED ORDER — FENTANYL CITRATE (PF) 100 MCG/2ML IJ SOLN
INTRAMUSCULAR | Status: DC | PRN
  Administered 2017-06-17 (×3): 50 ug via INTRAVENOUS
  Administered 2017-06-17 (×4): 25 ug via INTRAVENOUS

## 2017-06-17 MED ORDER — SODIUM CHLORIDE 0.9 % IR SOLN
Status: DC | PRN
  Administered 2017-06-17: 24000 mL via INTRAVESICAL

## 2017-06-17 MED ORDER — BELLADONNA ALKALOIDS-OPIUM 16.2-60 MG RE SUPP
RECTAL | Status: DC | PRN
  Administered 2017-06-17: 1 via RECTAL

## 2017-06-17 MED ORDER — MORPHINE SULFATE (PF) 4 MG/ML IV SOLN
2.0000 mg | INTRAVENOUS | Status: DC | PRN
  Administered 2017-06-17: 2 mg via INTRAVENOUS
  Administered 2017-06-17: 4 mg via INTRAVENOUS
  Filled 2017-06-17 (×3): qty 1

## 2017-06-17 MED ORDER — MEPERIDINE HCL 50 MG/ML IJ SOLN: 6.2500 mg | INTRAMUSCULAR | Status: DC | PRN

## 2017-06-17 MED ORDER — DIPHENHYDRAMINE HCL 50 MG/ML IJ SOLN: 12.5000 mg | Freq: Four times a day (QID) | INTRAMUSCULAR | Status: DC | PRN

## 2017-06-17 MED ORDER — SODIUM CHLORIDE 0.9 % IR SOLN
3000.0000 mL | Status: DC
  Administered 2017-06-17 – 2017-06-18 (×16): 3000 mL

## 2017-06-17 MED ORDER — DEXAMETHASONE SODIUM PHOSPHATE 10 MG/ML IJ SOLN
INTRAMUSCULAR | Status: DC | PRN
  Administered 2017-06-17: 5 mg via INTRAVENOUS

## 2017-06-17 MED ORDER — PHENAZOPYRIDINE HCL 200 MG PO TABS: 200.0000 mg | ORAL_TABLET | Freq: Three times a day (TID) | ORAL | 0 refills | Status: AC | PRN

## 2017-06-17 MED ORDER — DOCUSATE SODIUM 100 MG PO CAPS
100.0000 mg | ORAL_CAPSULE | Freq: Two times a day (BID) | ORAL | Status: DC
Start: 2017-06-17 — End: 2017-06-18
  Administered 2017-06-17 – 2017-06-18 (×2): 100 mg via ORAL
  Filled 2017-06-17 (×3): qty 1

## 2017-06-17 MED ORDER — LACTATED RINGERS IV SOLN
INTRAVENOUS | Status: DC
  Administered 2017-06-17: 14:00:00 via INTRAVENOUS

## 2017-06-17 MED ORDER — CIPROFLOXACIN HCL 500 MG PO TABS: 500.0000 mg | ORAL_TABLET | Freq: Two times a day (BID) | ORAL | 0 refills | Status: AC

## 2017-06-17 MED ORDER — SODIUM CHLORIDE 0.9 % IV SOLN
INTRAVENOUS | Status: DC
  Administered 2017-06-17 (×2): via INTRAVENOUS

## 2017-06-17 MED ORDER — CIPROFLOXACIN HCL 500 MG PO TABS
500.0000 mg | ORAL_TABLET | Freq: Two times a day (BID) | ORAL | Status: DC
  Administered 2017-06-17 – 2017-06-18 (×2): 500 mg via ORAL
  Filled 2017-06-17 (×3): qty 1

## 2017-06-17 MED ORDER — LACTATED RINGERS IV SOLN
INTRAVENOUS | Status: DC
  Administered 2017-06-17: 11:00:00 via INTRAVENOUS

## 2017-06-17 MED ORDER — LIDOCAINE 2% (20 MG/ML) 5 ML SYRINGE
INTRAMUSCULAR | Status: DC | PRN
  Administered 2017-06-17: 80 mg via INTRAVENOUS

## 2017-06-17 MED ORDER — BELLADONNA ALKALOIDS-OPIUM 16.2-60 MG RE SUPP
1.0000 | Freq: Four times a day (QID) | RECTAL | Status: DC | PRN
  Administered 2017-06-17: 1 via RECTAL
  Filled 2017-06-17: qty 1

## 2017-06-17 MED ORDER — HYDROMORPHONE HCL 1 MG/ML IJ SOLN: 0.2500 mg | INTRAMUSCULAR | Status: DC | PRN

## 2017-06-17 MED ORDER — OXYCODONE-ACETAMINOPHEN 5-325 MG PO TABS: 1.0000 | ORAL_TABLET | Freq: Four times a day (QID) | ORAL | 0 refills | Status: DC | PRN

## 2017-06-17 MED ORDER — DEXAMETHASONE SODIUM PHOSPHATE 10 MG/ML IJ SOLN
INTRAMUSCULAR | Status: AC
  Filled 2017-06-17: qty 1

## 2017-06-17 MED ORDER — ONDANSETRON HCL 4 MG/2ML IJ SOLN
INTRAMUSCULAR | Status: DC | PRN
  Administered 2017-06-17: 4 mg via INTRAVENOUS

## 2017-06-17 MED ORDER — OXYBUTYNIN CHLORIDE 5 MG PO TABS
5.0000 mg | ORAL_TABLET | Freq: Three times a day (TID) | ORAL | Status: DC | PRN
  Administered 2017-06-17: 5 mg via ORAL
  Filled 2017-06-17: qty 1

## 2017-06-17 MED ORDER — CIPROFLOXACIN IN D5W 400 MG/200ML IV SOLN
400.0000 mg | Freq: Once | INTRAVENOUS | Status: AC
  Administered 2017-06-17: 400 mg via INTRAVENOUS
  Filled 2017-06-17: qty 200

## 2017-06-17 MED ORDER — MIDAZOLAM HCL 2 MG/2ML IJ SOLN
INTRAMUSCULAR | Status: AC
  Filled 2017-06-17: qty 2

## 2017-06-17 MED ORDER — BELLADONNA-OPIUM 16.2-30 MG RE SUPP
RECTAL | Status: AC
  Filled 2017-06-17: qty 1

## 2017-06-17 MED ORDER — PROPOFOL 10 MG/ML IV BOLUS
INTRAVENOUS | Status: DC | PRN
  Administered 2017-06-17: 180 mg via INTRAVENOUS

## 2017-06-17 MED ORDER — DIPHENHYDRAMINE HCL 12.5 MG/5ML PO ELIX
12.5000 mg | ORAL_SOLUTION | Freq: Four times a day (QID) | ORAL | Status: DC | PRN
  Filled 2017-06-17: qty 10

## 2017-06-17 MED ORDER — SENNOSIDES-DOCUSATE SODIUM 8.6-50 MG PO TABS
1.0000 | ORAL_TABLET | Freq: Every evening | ORAL | Status: DC | PRN
  Filled 2017-06-17: qty 1

## 2017-06-17 MED ORDER — LIDOCAINE 2% (20 MG/ML) 5 ML SYRINGE
INTRAMUSCULAR | Status: AC
  Filled 2017-06-17: qty 5

## 2017-06-17 MED ORDER — PROPOFOL 10 MG/ML IV BOLUS
INTRAVENOUS | Status: AC
  Filled 2017-06-17: qty 20

## 2017-06-17 MED ORDER — PROMETHAZINE HCL 25 MG/ML IJ SOLN: 6.2500 mg | INTRAMUSCULAR | Status: DC | PRN

## 2017-06-17 SURGICAL SUPPLY — 21 items
BAG URINE DRAINAGE (UROLOGICAL SUPPLIES) ×3 IMPLANT
BAG URO CATCHER STRL LF (MISCELLANEOUS) ×3 IMPLANT
CATH FOLEY 3WAY 30CC 22FR (CATHETERS) IMPLANT
CATH FOLEY 3WAY 30CC 24FR (CATHETERS) ×2
CATH URTH STD 24FR FL 3W 2 (CATHETERS) ×1 IMPLANT
COVER FOOTSWITCH UNIV (MISCELLANEOUS) ×3 IMPLANT
COVER SURGICAL LIGHT HANDLE (MISCELLANEOUS) ×3 IMPLANT
GLOVE BIOGEL M STRL SZ7.5 (GLOVE) ×3 IMPLANT
GOWN STRL REUS W/TWL LRG LVL3 (GOWN DISPOSABLE) ×3 IMPLANT
GOWN STRL REUS W/TWL XL LVL3 (GOWN DISPOSABLE) ×3 IMPLANT
HOLDER FOLEY CATH W/STRAP (MISCELLANEOUS) ×3 IMPLANT
LOOP CUT BIPOLAR 24F LRG (ELECTROSURGICAL) ×3 IMPLANT
MANIFOLD NEPTUNE II (INSTRUMENTS) ×3 IMPLANT
PACK CYSTO (CUSTOM PROCEDURE TRAY) ×3 IMPLANT
PIN SAFETY STERILE (MISCELLANEOUS) ×3 IMPLANT
RUBBERBAND STERILE (MISCELLANEOUS) ×3 IMPLANT
SET ASPIRATION TUBING (TUBING) IMPLANT
SYRINGE IRR TOOMEY STRL 70CC (SYRINGE) ×3 IMPLANT
TUBING CONNECTING 10 (TUBING) ×2 IMPLANT
TUBING CONNECTING 10' (TUBING) ×1
TUBING UROLOGY SET (TUBING) ×3 IMPLANT

## 2017-06-17 NOTE — Op Note (Signed)
Operative Note  Preoperative diagnosis:  1.  BPH with lower urinary tract symptoms 2.  Urinary retention  Postoperative diagnosis: 1.  BPH with lower urinary tract symptoms 2.  Urinary retention  Procedure(s): 1.  Bipolar TURP  Surgeon: Rhoderick Moody, MD  Assistants: None  Anesthesia: General LMA  Complications: None  EBL: 50 mL  Specimens: 1.  Prostate chips  Drains/Catheters: 1.  24 French three-way Foley catheter with 30 mL in the balloon  Intraoperative findings:   1. 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 male with with a history of BPH/LUTS (currently on tamsulosin BID).  He has a history of urinary retention and was seen on 06/06/17 and was found to have a PVR of 800 mL and a possible UTI.  His urine culture grew multiple Staph species sensitive to cipro. After multiple unsuccessful attempts to place a Foley catheter, he eventually had a catheter placed cystoscopically.  His cysto revealed tri-lobar prostatic urethral obstruction with a prominent median lobe.   He has been consented for the above procedures, voices understanding and wishes to proceed.  Description of procedure:  After informed consent was obtained, the patient was brought to the operating room and general LMA anesthesia was administered. The patient was then placed in the dorsolithotomy position and prepped and draped in usual sterile fashion. A timeout was performed. A 23 French rigid cystoscope was then inserted into the urethral meatus and advanced into the bladder under direct vision. A complete bladder survey revealed no intravesical pathology.  Both ureteral orifices were identified and well away from the bladder neck.  The rigid cystoscope was then exchanged for a 26 French resectoscope with a bipolar loop working element.  Starting at the bladder neck and progressing distally to  the verumontanum, the prostatic adenoma was systematically resected until a widely patent prostatic urethral channel was created.  All prostate chips were then hand irrigated out of the bladder and sent to pathology for permanent section.  The resectoscope was then removed and exchanged for a 22 French three-way Foley catheter.  The three-way Foley catheter was then extensively hand irrigated until the irrigant returned clear to light pink and no prostate chips were irrigated.  The catheter was then placed to continuous bladder irrigation and placed on rubber band traction.  He tolerated the procedure well and was transferred to the postanesthesia unit in stable condition.   Plan: CBI overnight.  Remove traction from Foley catheter for a.m. on 06/18/2017.  Voiding trial on 06/23/2017 in the office.

## 2017-06-17 NOTE — Anesthesia Procedure Notes (Signed)
Procedure Name: LMA Insertion Date/Time: 06/17/2017 12:28 PM Performed by: Epimenio Sarin, CRNA Pre-anesthesia Checklist: Patient identified, Emergency Drugs available, Suction available, Patient being monitored and Timeout performed Patient Re-evaluated:Patient Re-evaluated prior to induction Oxygen Delivery Method: Circle system utilized Preoxygenation: Pre-oxygenation with 100% oxygen Induction Type: IV induction LMA: LMA with gastric port inserted LMA Size: 4.0 Number of attempts: 1 Dental Injury: Teeth and Oropharynx as per pre-operative assessment

## 2017-06-17 NOTE — Transfer of Care (Signed)
Immediate Anesthesia Transfer of Care Note  Patient: Paul Chase  Procedure(s) Performed: TRANSURETHRAL RESECTION OF THE PROSTATE (TURP) WITH CYSTOSCOPY (N/A Prostate)  Patient Location: PACU  Anesthesia Type:General  Level of Consciousness: drowsy and patient cooperative  Airway & Oxygen Therapy: Patient Spontanous Breathing and Patient connected to face mask oxygen  Post-op Assessment: Report given to RN and Post -op Vital signs reviewed and stable  Post vital signs: Reviewed and stable  Last Vitals:  Vitals Value Taken Time  BP 135/66 06/17/2017  1:51 PM  Temp    Pulse 90 06/17/2017  1:55 PM  Resp 13 06/17/2017  1:55 PM  SpO2 99 % 06/17/2017  1:55 PM  Vitals shown include unvalidated device data.  Last Pain:  Vitals:   06/17/17 1032  TempSrc:   PainSc: 0-No pain         Complications: No apparent anesthesia complications

## 2017-06-17 NOTE — Progress Notes (Signed)
Patient's urine very dark red, continues to pass very large blood clots through foley catheter even though CBI continues as fast as it can go. Pt expresses discomfort and pressure in bladder when passing clots. Foley catheter currently draining dark red urine. On-call MD MacDiarmid notified and ordered H&H now along with PRN hand irrigation of FC. On-coming RN aware.

## 2017-06-17 NOTE — Anesthesia Postprocedure Evaluation (Signed)
Anesthesia Post Note  Patient: Paul Chase  Procedure(s) Performed: TRANSURETHRAL RESECTION OF THE PROSTATE (TURP) WITH CYSTOSCOPY (N/A Prostate)     Patient location during evaluation: PACU Anesthesia Type: General Level of consciousness: awake and alert Pain management: pain level controlled Vital Signs Assessment: post-procedure vital signs reviewed and stable Respiratory status: spontaneous breathing, nonlabored ventilation, respiratory function stable and patient connected to nasal cannula oxygen Cardiovascular status: blood pressure returned to baseline and stable Postop Assessment: no apparent nausea or vomiting Anesthetic complications: no    Last Vitals:  Vitals:   06/17/17 1440 06/17/17 1500  BP:  (!) 148/79  Pulse: 71   Resp: 14 14  Temp:  36.9 C  SpO2: 100% 96%    Last Pain:  Vitals:   06/17/17 1351  TempSrc:   PainSc: 0-No pain                 Shelton Silvas

## 2017-06-17 NOTE — Anesthesia Preprocedure Evaluation (Addendum)
Anesthesia Evaluation  Patient identified by MRN, date of birth, ID band Patient awake    Reviewed: Allergy & Precautions, NPO status , Patient's Chart, lab work & pertinent test results  Airway Mallampati: II  TM Distance: >3 FB Neck ROM: Full    Dental  (+) Missing, Poor Dentition, Dental Advisory Given,    Pulmonary Current Smoker,    breath sounds clear to auscultation       Cardiovascular hypertension, Pt. on medications and Pt. on home beta blockers  Rhythm:Regular Rate:Normal     Neuro/Psych PSYCHIATRIC DISORDERS CVA    GI/Hepatic negative GI ROS, Neg liver ROS,   Endo/Other  diabetes, Type 2, Oral Hypoglycemic Agents  Renal/GU negative Renal ROS     Musculoskeletal negative musculoskeletal ROS (+)   Abdominal Normal abdominal exam  (+)   Peds  Hematology negative hematology ROS (+)   Anesthesia Other Findings - HLD  Reproductive/Obstetrics                            Anesthesia Physical Anesthesia Plan  ASA: II  Anesthesia Plan: General   Post-op Pain Management:    Induction: Intravenous  PONV Risk Score and Plan: 2 and Ondansetron, Dexamethasone and Midazolam  Airway Management Planned: LMA  Additional Equipment: None  Intra-op Plan:   Post-operative Plan: Extubation in OR  Informed Consent: I have reviewed the patients History and Physical, chart, labs and discussed the procedure including the risks, benefits and alternatives for the proposed anesthesia with the patient or authorized representative who has indicated his/her understanding and acceptance.   Dental advisory given  Plan Discussed with: CRNA  Anesthesia Plan Comments:       Anesthesia Quick Evaluation

## 2017-06-17 NOTE — Plan of Care (Signed)

## 2017-06-18 DIAGNOSIS — N401 Enlarged prostate with lower urinary tract symptoms: Secondary | ICD-10-CM | POA: Diagnosis not present

## 2017-06-18 LAB — BASIC METABOLIC PANEL
Anion gap: 8 (ref 5–15)
BUN: 16 mg/dL (ref 6–20)
CALCIUM: 8.2 mg/dL — AB (ref 8.9–10.3)
CO2: 25 mmol/L (ref 22–32)
CREATININE: 1.24 mg/dL (ref 0.61–1.24)
Chloride: 102 mmol/L (ref 101–111)
GFR calc non Af Amer: >60 mL/min (ref >60)
Glucose, Bld: 163 mg/dL — ABNORMAL HIGH (ref 65–99)
Potassium: 4.4 mmol/L (ref 3.5–5.1)
SODIUM: 135 mmol/L (ref 135–145)

## 2017-06-18 LAB — HEMOGLOBIN AND HEMATOCRIT, BLOOD
HEMATOCRIT: 34.2 % — AB (ref 39.0–52.0)
HEMOGLOBIN: 11.1 g/dL — AB (ref 13.0–17.0)

## 2017-06-18 NOTE — Progress Notes (Signed)
Looks great Light pink urine after last night issues Hb good Belly soft Send home with foley and f/up scheduled

## 2017-06-18 NOTE — Progress Notes (Signed)
Reviewed discharge information with patient and sig. other. Answered all questions. Pt/sig. other able to teach back medications, foley care and reasons to contact MD/911. Patient demonstrates understanding of changing between leg and drainage bag using clean technique. Patient verbalizes importance of PCP follow up appointment.  Earnest Conroy. Clelia Croft, RN

## 2017-06-18 NOTE — Discharge Instructions (Signed)
I have reviewed discharge instructions in detail with the patient. They will follow-up with me or their physician as scheduled. My nurse will also be calling the patients as per protocol.   

## 2017-06-18 NOTE — Progress Notes (Signed)
Traction has been removed from foley at this time.

## 2017-06-18 NOTE — Discharge Summary (Signed)
Date of admission: 06/17/2017  Date of discharge: 06/18/2017  Admission diagnosis: BPH and frequency  Discharge diagnosis: BPH and frequency  Secondary diagnoses: Nocturia  History and Physical: For full details, please see admission history and physical. Briefly, Paul Chase is a 62 y.o. year old patient with above diagnosis.   Hospital Course: TURP; posp op clots and bleeding managed with CBI; did well and went home  Laboratory values:  Recent Labs    06/17/17 2014 06/18/17 0422  HGB 12.6* 11.1*  HCT 38.9* 34.2*   Recent Labs    06/18/17 0422  CREATININE 1.24    Disposition: Home  Discharge instruction: The patient was instructed to be ambulatory but told to refrain from heavy lifting, strenuous activity, or driving. Detailed.   Discharge medications:  Allergies as of 06/18/2017   No Known Allergies     Medication List    TAKE these medications   amLODipine 5 MG tablet Commonly known as:  NORVASC Take 1 tablet (5 mg total) by mouth daily.   aspirin EC 81 MG tablet Take 81 mg daily by mouth.   bisoprolol-hydrochlorothiazide 5-6.25 MG tablet Commonly known as:  ZIAC Take 1 tablet daily by mouth.   ciprofloxacin 500 MG tablet Commonly known as:  CIPRO Take 1 tablet (500 mg total) by mouth 2 (two) times daily for 3 days.   gabapentin 300 MG capsule Commonly known as:  NEURONTIN Take 300 mg 2 (two) times daily by mouth.   glimepiride 4 MG tablet Commonly known as:  AMARYL Take 4 mg daily with breakfast by mouth.   ondansetron 4 MG tablet Commonly known as:  ZOFRAN Take 1 tablet (4 mg total) by mouth daily as needed for nausea or vomiting.   oxyCODONE-acetaminophen 5-325 MG tablet Commonly known as:  PERCOCET Take 1 tablet by mouth every 6 (six) hours as needed for severe pain.   phenazopyridine 200 MG tablet Commonly known as:  PYRIDIUM Take 1 tablet (200 mg total) by mouth 3 (three) times daily as needed (for pain with urination).   simvastatin  40 MG tablet Commonly known as:  ZOCOR Take 40 mg at bedtime by mouth.   tamsulosin 0.4 MG Caps capsule Commonly known as:  FLOMAX Take 1 capsule (0.4 mg total) by mouth daily. What changed:  when to take this   travoprost (benzalkonium) 0.004 % ophthalmic solution Commonly known as:  TRAVATAN Place 1 drop at bedtime into both eyes.       Followup:  Follow-up Information    Rene Paci, MD Follow up on 06/23/2017.   Specialty:  Urology Why:  Catheter removal Contact information: [address] [place] [address] [phone]

## 2018-03-21 ENCOUNTER — Other Ambulatory Visit: Payer: Self-pay | Admitting: Family Medicine

## 2018-03-21 ENCOUNTER — Ambulatory Visit
Admission: RE | Admit: 2018-03-21 | Discharge: 2018-03-21 | Disposition: A | Discharge status: Home / Self Care | Payer: Medicare Other | Source: Ambulatory Visit | Attending: Family Medicine | Admitting: Family Medicine

## 2018-03-21 DIAGNOSIS — M5440 Lumbago with sciatica, unspecified side: Secondary | ICD-10-CM

## 2020-02-29 ENCOUNTER — Other Ambulatory Visit: Payer: Self-pay | Admitting: Nurse Practitioner

## 2020-02-29 DIAGNOSIS — M79652 Pain in left thigh: Secondary | ICD-10-CM

## 2020-03-03 ENCOUNTER — Ambulatory Visit
Admission: RE | Admit: 2020-03-03 | Discharge: 2020-03-03 | Disposition: A | Discharge status: Home / Self Care | Payer: Medicare Other | Source: Ambulatory Visit | Attending: Nurse Practitioner | Admitting: Nurse Practitioner

## 2020-03-03 DIAGNOSIS — M79652 Pain in left thigh: Secondary | ICD-10-CM

## 2020-03-12 ENCOUNTER — Encounter (HOSPITAL_COMMUNITY): Payer: Self-pay

## 2020-03-12 ENCOUNTER — Ambulatory Visit (HOSPITAL_COMMUNITY)
Admission: EM | Admit: 2020-03-12 | Discharge: 2020-03-12 | Disposition: A | Discharge status: Home / Self Care | Payer: Medicare Other | Attending: Medical Oncology | Admitting: Medical Oncology

## 2020-03-12 ENCOUNTER — Other Ambulatory Visit: Payer: Self-pay

## 2020-03-12 ENCOUNTER — Telehealth (HOSPITAL_COMMUNITY): Payer: Self-pay

## 2020-03-12 ENCOUNTER — Ambulatory Visit (INDEPENDENT_AMBULATORY_CARE_PROVIDER_SITE_OTHER): Payer: Medicare Other

## 2020-03-12 DIAGNOSIS — M79605 Pain in left leg: Secondary | ICD-10-CM | POA: Diagnosis not present

## 2020-03-12 DIAGNOSIS — M25552 Pain in left hip: Secondary | ICD-10-CM | POA: Diagnosis not present

## 2020-03-12 MED ORDER — KETOROLAC TROMETHAMINE 30 MG/ML IJ SOLN
INTRAMUSCULAR | Status: AC
  Filled 2020-03-12: qty 1

## 2020-03-12 MED ORDER — GABAPENTIN 300 MG PO CAPS: 300.0000 mg | ORAL_CAPSULE | Freq: Two times a day (BID) | ORAL | 0 refills | Status: DC

## 2020-03-12 MED ORDER — KETOROLAC TROMETHAMINE 30 MG/ML IJ SOLN
30.0000 mg | Freq: Once | INTRAMUSCULAR | Status: AC
  Administered 2020-03-12: 30 mg via INTRAMUSCULAR

## 2020-03-12 MED ORDER — PREDNISONE 10 MG PO TABS: ORAL_TABLET | ORAL | 0 refills | Status: DC

## 2020-03-12 MED ORDER — GABAPENTIN 300 MG PO CAPS: 300.0000 mg | ORAL_CAPSULE | Freq: Two times a day (BID) | ORAL | 0 refills | Status: AC

## 2020-03-12 NOTE — ED Triage Notes (Signed)
Pt presents with left leg pain that has been going on for 3 weeks. He states he has not been able to sleep. Pt states the pain is located above the knee, center of his thigh.
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
## 2020-04-02 ENCOUNTER — Other Ambulatory Visit: Payer: Self-pay

## 2020-04-02 ENCOUNTER — Emergency Department (HOSPITAL_COMMUNITY)
Admission: EM | Admit: 2020-04-02 | Discharge: 2020-04-02 | Disposition: A | Discharge status: Home / Self Care | Payer: Medicare Other | Attending: Emergency Medicine | Admitting: Emergency Medicine

## 2020-04-02 ENCOUNTER — Encounter (HOSPITAL_COMMUNITY): Payer: Self-pay

## 2020-04-02 DIAGNOSIS — Z7984 Long term (current) use of oral hypoglycemic drugs: Secondary | ICD-10-CM | POA: Insufficient documentation

## 2020-04-02 DIAGNOSIS — Z79899 Other long term (current) drug therapy: Secondary | ICD-10-CM | POA: Insufficient documentation

## 2020-04-02 DIAGNOSIS — E119 Type 2 diabetes mellitus without complications: Secondary | ICD-10-CM | POA: Diagnosis not present

## 2020-04-02 DIAGNOSIS — M79605 Pain in left leg: Secondary | ICD-10-CM

## 2020-04-02 DIAGNOSIS — Z7982 Long term (current) use of aspirin: Secondary | ICD-10-CM | POA: Insufficient documentation

## 2020-04-02 DIAGNOSIS — F17210 Nicotine dependence, cigarettes, uncomplicated: Secondary | ICD-10-CM | POA: Insufficient documentation

## 2020-04-02 DIAGNOSIS — I10 Essential (primary) hypertension: Secondary | ICD-10-CM | POA: Insufficient documentation

## 2020-04-02 MED ORDER — HYDROCODONE-ACETAMINOPHEN 5-325 MG PO TABS
1.0000 | ORAL_TABLET | Freq: Once | ORAL | Status: AC
  Administered 2020-04-02: 1 via ORAL
  Filled 2020-04-02: qty 1

## 2020-04-02 MED ORDER — PREDNISONE 10 MG (21) PO TBPK: ORAL_TABLET | Freq: Every day | ORAL | 0 refills | Status: DC

## 2020-04-02 NOTE — ED Provider Notes (Signed)
[facility]EMERGENCY DEPT Provider Note   CSN: [id number] Arrival date & time: 04/02/20  3825     History Chief Complaint  Patient presents with  . Leg Pain    Paul Chase is a 65 y.o. male presenting for evaluation of leg pain.   Patient states for the past month he has had persistent left anterior leg pain.  Pain is constant with intermittent sharp shooting burning sensation of the anterior leg.  It never extends past his knee.  He denies fall, trauma, or injury.  No symptoms on the left side.  It is not worse with any particular movement.  He does been a lot of time sitting and wears a belt regularly, however he denies significant tightness around the belt.  He has been seen at urgent care and his primary care doctor for this, had a negative ultrasound and x-ray.  He has been tried on different medications including NSAIDs and muscle relaxer.  He is currently on gabapentin which she reports helps a little bit.  He has never seen orthopedics for this.  He denies back pain.  No fevers, chills, nausea, vomiting, abdominal pain, urinary symptoms.  Additional history obtained from chart review.  Patient with a history of BPH, diabetes, hypertension, hyperlipidemia, stroke   HPI     Past Medical History:  Diagnosis Date  . BPH (benign prostatic hyperplasia)   . Diabetes mellitus without complication (HCC)    type 2  . Foley catheter in place    due to BPH  . Glaucoma   . Hyperlipidemia   . Hypertension   . Neuropathy    pt. denies  . Stroke [facility]) 2016   minor left side mild weakness    Patient Active Problem List   Diagnosis Date Noted  . BPH with urinary obstruction 06/17/2017  . Gross hematuria 04/15/2017  . Urinary retention 04/14/2017  . Cocaine abuse with cocaine-induced mood disorder (HCC) 12/03/2016  . Alcohol abuse     Past Surgical History:  Procedure Laterality Date  . APPENDECTOMY    . EYE SURGERY     r eye cataract  .  TRANSURETHRAL RESECTION OF PROSTATE N/A 06/17/2017   Procedure: TRANSURETHRAL RESECTION OF THE PROSTATE (TURP) WITH CYSTOSCOPY;  Surgeon: Rene Paci, MD;  Location: [facility];  Service: Urology;  Laterality: N/A;       No family history on file.  Social History   Tobacco Use  . Smoking status: Current Every Day Smoker    Packs/day: 0.50    Types: Cigarettes  . Smokeless tobacco: Never Used  Vaping Use  . Vaping Use: Never used  Substance Use Topics  . Alcohol use: Yes    Comment: rare  . Drug use: Yes    Types: Cocaine    Comment: "as much as I can"  not in 10 years    Home Medications Prior to Admission medications   Medication Sig Start Date End Date Taking? Authorizing Provider  predniSONE (STERAPRED UNI-PAK 21 TAB) 10 MG (21) TBPK tablet Take by mouth daily. Take 6 tabs by mouth daily  for 2 days, then 5 tabs for 2 days, then 4 tabs for 2 days, then 3 tabs for 2 days, 2 tabs for 2 days, then 1 tab by mouth daily for 2 days 04/02/20  Yes Wilmon Conover, PA-C  amLODipine (NORVASC) 5 MG tablet Take 1 tablet (5 mg total) by mouth daily. Patient not taking: Reported on 06/08/2017 04/16/17   Toney Rakes,  MD  aspirin EC 81 MG tablet Take 81 mg daily by mouth.    [provider]  bisoprolol-hydrochlorothiazide (ZIAC) 5-6.25 MG tablet Take 1 tablet daily by mouth.    [provider]  gabapentin (NEURONTIN) 300 MG capsule Take 1 capsule (300 mg total) by mouth 2 (two) times daily. 03/12/20   Mardella Layman, MD  glimepiride (AMARYL) 4 MG tablet Take 4 mg daily with breakfast by mouth.    [provider]  oxyCODONE-acetaminophen (PERCOCET) 5-325 MG tablet Take 1 tablet by mouth every 6 (six) hours as needed for severe pain. 06/17/17   Rene Paci, MD  simvastatin (ZOCOR) 40 MG tablet Take 40 mg at bedtime by mouth.    [provider]  tamsulosin (FLOMAX) 0.4 MG CAPS capsule Take 1 capsule (0.4 mg total) by mouth  daily. Patient taking differently: Take 0.4 mg by mouth 2 (two) times daily.  04/16/17   Lacroce, Ames Coupe, MD  travoprost, benzalkonium, (TRAVATAN) 0.004 % ophthalmic solution Place 1 drop at bedtime into both eyes.    [provider]    Allergies    Patient has no known allergies.  Review of Systems   Review of Systems  Musculoskeletal: Positive for myalgias.  Neurological: Negative for numbness.  Hematological: Does not bruise/bleed easily.    Physical Exam Updated Vital Signs BP 134/80 (BP Location: Left Arm)   Pulse 98   Temp 97.8 F (36.6 C) (Oral)   Resp 18   Ht 6\' 1"  (1.854 m)   Wt 93 kg   SpO2 100%   BMI 27.05 kg/m   Physical Exam Vitals and nursing note reviewed.  Constitutional:      General: He is not in acute distress.    Appearance: He is well-developed and well-nourished.     Comments: Resting in the bed in NAD  HENT:     Head: Normocephalic and atraumatic.  Eyes:     Extraocular Movements: EOM normal.  Pulmonary:     Effort: Pulmonary effort is normal.  Abdominal:     General: There is no distension.     Palpations: Abdomen is soft. There is no mass.     Tenderness: There is no abdominal tenderness. There is no guarding or rebound.     Comments: No ttp  Musculoskeletal:        General: Tenderness present. Normal range of motion.     Cervical back: Normal range of motion.     Comments: Tenderness palpation of anterior left upper leg.  No erythema, warmth, induration of the skin.  No lesions.  No tenderness palpation over hip, buttock, or left knee.  No tenderness palpation of back or midline spine.  Patient able to go from laying to sitting without difficulty or signs of pain. Pedal pulses 2+ bilaterally.  Good distal sensation.  No saddle anesthesia  Skin:    General: Skin is warm.     Capillary Refill: Capillary refill takes less than 2 seconds.     Findings: No rash.  Neurological:     Mental Status: He is alert and oriented to  person, place, and time.  Psychiatric:        Mood and Affect: Mood and affect normal.     ED Results / Procedures / Treatments   Labs (all labs ordered are listed, but only abnormal results are displayed) Labs Reviewed - No data to display  EKG None  Radiology No results found.  Procedures Procedures   Medications Ordered in  ED Medications  HYDROcodone-acetaminophen (NORCO/VICODIN) 5-325 MG per tablet 1 tablet (1 tablet Oral Given 04/02/20 0915)    ED Course  I have reviewed the triage vital signs and the nursing notes.  Pertinent labs & imaging results that were available during my care of the patient were reviewed by me and considered in my medical decision making (see chart for details).    MDM Rules/Calculators/A&P                          Patient presenting for evaluation of left leg pain.  On exam, patient appears nontoxic.  He is neurovascularly intact.  Visual examination is overall reassuring, exam is not consistent with infection.  He has had a negative ultrasound and x-ray that were reviewed by myself.  As patient has not had trauma, I do not like he needs repeat x-ray.  As he has no calf pain and presentation would be atypical for DVT, I do not believe he needs repeat ultrasound.  As pain is only in the anterior thigh, consider meralgia paresthetica.  Also consider MSK cause.  Discussed options for treatment, patient is already on gabapentin has not had improvement with muscle relaxers.  Discussed a course of steroids, patient states he has never been on prednisone before.  I discussed risk for hypoglycemia if he is taking this, but he states his diabetes is well controlled and will check his blood sugars regularly.  I encourage follow-up with Ortho for further evaluation.  Continue use of gabapentin and Tylenol as needed.  At this time, patient appears safe for discharge.  Return precautions given.  Patient states he understands and agrees to plan.  Final Clinical  Impression(s) / ED Diagnoses Final diagnoses:  Left leg pain    Rx / DC Orders ED Discharge Orders         Ordered    predniSONE (STERAPRED UNI-PAK 21 TAB) 10 MG (21) TBPK tablet  Daily        04/02/20 0859           Alveria Apley, PA-C 04/02/20 1004    Gwyneth Sprout, MD 04/02/20 2131

## 2020-04-02 NOTE — ED Notes (Signed)
Pt family taking pt home

## 2020-04-02 NOTE — Discharge Instructions (Signed)
Take prednisone as prescribed.  Take entire course, even if symptoms improve. Have caution with your blood sugars while taking this medicine.  It is extremely important that you are checking your blood sugars regularly, if they are too elevated stop the medicine and follow-up with your primary care doctor. Continue taking Tylenol for pain. Continue taking gabapentin to help with pain. Use ice to help with pain and swelling. Follow-up with orthopedic doctor listed below for further evaluation of your symptoms. Return to the emergency room if you develop high fevers, severe worsening pain, persistent numbness, inability to walk, loss of bowel bladder control, or any new, worsening, or concerning symptoms.

## 2020-04-02 NOTE — ED Triage Notes (Addendum)
Pt c/o left leg pain- states pain is above knee in thigh area x 1 month- c/o minor swelling and painful to touch. Pt denies injury. Pt had [place] and went to UC and had xrays done, rx for muscle relaxers w/o relief. Able to ambulate, but pain when walking

## 2020-04-11 ENCOUNTER — Other Ambulatory Visit: Payer: Self-pay

## 2020-04-11 ENCOUNTER — Ambulatory Visit: Payer: Self-pay

## 2020-04-11 ENCOUNTER — Ambulatory Visit (INDEPENDENT_AMBULATORY_CARE_PROVIDER_SITE_OTHER): Payer: Medicare Other | Admitting: Family

## 2020-04-11 DIAGNOSIS — M5416 Radiculopathy, lumbar region: Secondary | ICD-10-CM

## 2020-04-11 MED ORDER — HYDROCODONE-ACETAMINOPHEN 5-325 MG PO TABS: 1.0000 | ORAL_TABLET | Freq: Four times a day (QID) | ORAL | 0 refills | Status: DC | PRN

## 2020-04-15 ENCOUNTER — Encounter: Payer: Self-pay | Admitting: Family

## 2020-04-15 NOTE — Progress Notes (Signed)
Office Visit Note   Patient: Paul Chase           Date of Birth: [birth year]           MRN: [id number] Visit Date: 04/11/2020              Requested by: Laruth Bouchard, MD [address] [place],  [place] [address] PCP: Laruth Bouchard, MD  Chief Complaint  Patient presents with  . Left Leg - Pain      HPI: The patient is a 65 year old gentleman who presents today with a chief complaint of left anterior thigh pain.  This does not radiate to his groin does not radiate past his knee pain primarily in the anterior thigh denies any back pain states the front of his thigh does burn and have shooting pain from time to time this is been ongoing for many weeks he has been seen by several different providers for the same complaint.  Most recently seen in the emergency department on March 9.  He has not had any associated injury he did at that time have radiographs of his hip and pelvis which were negative as well as an ultrasound that was negative for DVT.  He has tried prednisone with no relief.  He is also taking gabapentin which provides minimal relief  Assessment & Plan: Visit Diagnoses:  1. Radiculopathy, lumbar region     Plan: Discussed conservative measures.  Patient would like to proceed with ESI.  We will refer to Dr. Alvester Morin.  If no relief with first injection we will consider MRI of the lumbar spine  Follow-Up Instructions: Return in about 4 weeks (around 05/09/2020), or if symptoms worsen or fail to improve.   Back Exam   Tenderness  The patient is experiencing no tenderness.   Muscle Strength  The patient has normal back strength.  Tests  Straight leg raise right: negative Straight leg raise left: positive  Other  Gait: normal       Patient is alert, oriented, no adenopathy, well-dressed, normal affect, normal respiratory effort.   Imaging: No results found. No images are attached to the encounter.  Labs: Lab Results  Component Value Date   HGBA1C 6.2 (H)  06/13/2017   REPTSTATUS 04/15/2017 FINAL 04/14/2017   CULT  04/14/2017    NO GROWTH Performed at [facility], [address] [place], [place] [address]      Lab Results  Component Value Date   ALBUMIN 4.1 04/14/2017   ALBUMIN 4.3 12/03/2016   ALBUMIN 4.0 12/02/2016    No results found for: MG No results found for: VD25OH  No results found for: PREALBUMIN CBC EXTENDED Latest Ref Rng & Units 06/18/2017 06/17/2017 06/13/2017  WBC 4.0 - 10.5 K/uL - - 8.7  RBC 4.22 - 5.81 MIL/uL - - 4.85  HGB 13.0 - 17.0 g/dL 11.1(L) 12.6(L) 14.5  HCT 39.0 - 52.0 % 34.2(L) 38.9(L) 43.3  PLT 150 - 400 K/uL - - 346  NEUTROABS 1.7 - 7.7 K/uL - - -  LYMPHSABS 0.7 - 4.0 K/uL - - -     There is no height or weight on file to calculate BMI.  Orders:  Orders Placed This Encounter  Procedures  . XR Lumbar Spine 2-3 Views  . Ambulatory referral to Physical Medicine Rehab   Meds ordered this encounter  Medications  . DISCONTD: HYDROcodone-acetaminophen (NORCO) 5-325 MG tablet    Sig: Take 1 tablet by mouth every 6 (six) hours as needed.  Dispense:  30 tablet    Refill:  0  . HYDROcodone-acetaminophen (NORCO) 5-325 MG tablet    Sig: Take 1 tablet by mouth every 6 (six) hours as needed.    Dispense:  30 tablet    Refill:  0     Procedures: No procedures performed  Clinical Data: No additional findings.  ROS:  All other systems negative, except as noted in the HPI. Review of Systems  Constitutional: Negative for chills and fever.  Musculoskeletal: Negative for back pain and joint swelling.  Neurological: Negative for weakness and numbness.    Objective: Vital Signs: There were no vitals taken for this visit.  Specialty Comments:  No specialty comments available.  PMFS History: Patient Active Problem List   Diagnosis Date Noted  . BPH with urinary obstruction 06/17/2017  . Gross hematuria 04/15/2017  . Urinary retention 04/14/2017  . Cocaine abuse with cocaine-induced  mood disorder (HCC) 12/03/2016  . Alcohol abuse    Past Medical History:  Diagnosis Date  . BPH (benign prostatic hyperplasia)   . Diabetes mellitus without complication (HCC)    type 2  . Foley catheter in place    due to BPH  . Glaucoma   . Hyperlipidemia   . Hypertension   . Neuropathy    pt. denies  . Stroke [facility]) 2016   minor left side mild weakness    History reviewed. No pertinent family history.  Past Surgical History:  Procedure Laterality Date  . APPENDECTOMY    . EYE SURGERY     r eye cataract  . TRANSURETHRAL RESECTION OF PROSTATE N/A 06/17/2017   Procedure: TRANSURETHRAL RESECTION OF THE PROSTATE (TURP) WITH CYSTOSCOPY;  Surgeon: Rene Paci, MD;  Location: [facility];  Service: Urology;  Laterality: N/A;   Social History   Occupational History  . Not on file  Tobacco Use  . Smoking status: Current Every Day Smoker    Packs/day: 0.50    Types: Cigarettes  . Smokeless tobacco: Never Used  Vaping Use  . Vaping Use: Never used  Substance and Sexual Activity  . Alcohol use: Yes    Comment: rare  . Drug use: Yes    Types: Cocaine    Comment: "as much as I can"  not in 10 years  . Sexual activity: Yes

## 2020-05-01 ENCOUNTER — Ambulatory Visit (INDEPENDENT_AMBULATORY_CARE_PROVIDER_SITE_OTHER): Payer: Medicare Other | Admitting: Physical Medicine and Rehabilitation

## 2020-05-01 ENCOUNTER — Ambulatory Visit: Payer: Self-pay

## 2020-05-01 ENCOUNTER — Encounter: Payer: Self-pay | Admitting: Physical Medicine and Rehabilitation

## 2020-05-01 ENCOUNTER — Other Ambulatory Visit: Payer: Self-pay

## 2020-05-01 VITALS — BP 113/75 | HR 105

## 2020-05-01 DIAGNOSIS — M5416 Radiculopathy, lumbar region: Secondary | ICD-10-CM | POA: Diagnosis not present

## 2020-05-01 MED ORDER — BETAMETHASONE SOD PHOS & ACET 6 (3-3) MG/ML IJ SUSP
12.0000 mg | Freq: Once | INTRAMUSCULAR | Status: AC
Start: 2020-05-01 — End: 2020-05-01
  Administered 2020-05-01: 12 mg

## 2020-05-01 NOTE — Patient Instructions (Signed)

## 2020-05-01 NOTE — Procedures (Signed)
Lumbar Epidural Steroid Injection - Interlaminar Approach with Fluoroscopic Guidance  Patient: Paul Chase      Date of Birth: 13-Oct-1955 MRN: [id number] PCP: Laruth Bouchard, MD      Visit Date: 05/01/2020   Universal Protocol:     Consent Given By: the patient  Position: PRONE  Additional Comments: Vital signs were monitored before and after the procedure. Patient was prepped and draped in the usual sterile fashion. The correct patient, procedure, and site was verified.   Injection Procedure Details:   Procedure diagnoses: Lumbar radiculopathy [M54.16]   Meds Administered:  Meds ordered this encounter  Medications  . betamethasone acetate-betamethasone sodium phosphate (CELESTONE) injection 12 mg     Laterality: Left  Location/Site:  L5-S1  Needle: 3.5 in., 20 ga. Tuohy  Needle Placement: Paramedian epidural  Findings:   -Comments: Excellent flow of contrast into the epidural space.  Procedure Details: Using a paramedian approach from the side mentioned above, the region overlying the inferior lamina was localized under fluoroscopic visualization and the soft tissues overlying this structure were infiltrated with 4 ml. of 1% Lidocaine without Epinephrine. The Tuohy needle was inserted into the epidural space using a paramedian approach.   The epidural space was localized using loss of resistance along with counter oblique bi-planar fluoroscopic views.  After negative aspirate for air, blood, and CSF, a 2 ml. volume of Isovue-250 was injected into the epidural space and the flow of contrast was observed. Radiographs were obtained for documentation purposes.    The injectate was administered into the level noted above.   Additional Comments:  The patient tolerated the procedure well Dressing: 2 x 2 sterile gauze and Band-Aid    Post-procedure details: Patient was observed during the procedure. Post-procedure instructions were reviewed.  Patient left the clinic  in stable condition.

## 2020-05-01 NOTE — Progress Notes (Signed)
Pt state lower back pain tha travels to his left thigh. Pt state laying down and sitting makes the pain worse. Pt sate he take pain meds to help ease the pain.  Numeric Pain Rating Scale and Functional Assessment Average Pain 8   In the last MONTH (on 0-10 scale) has pain interfered with the following?  1. General activity like being  able to carry out your everyday physical activities such as walking, climbing stairs, carrying groceries, or moving a chair?  Rating(10)   +Driver, -BT, -Dye Allergies.

## 2020-05-01 NOTE — Progress Notes (Signed)
Paul Chase - 65 y.o. male MRN [id number]  Date of birth: Jun 18, 1955  Office Visit Note: Visit Date: 05/01/2020 PCP: Laruth Bouchard, MD Referred by: Laruth Bouchard, MD  Subjective: Chief Complaint  Patient presents with  . Lower Back - Pain  . Left Thigh - Pain   HPI:  Paul Chase is a 65 y.o. male who comes in today at the request of Dr. Lavada Mesi for planned Left L5-S1 Lumbar epidural steroid injection with fluoroscopic guidance.  The patient has failed conservative care including home exercise, medications, time and activity modification.  This injection will be diagnostic and hopefully therapeutic.  Please see requesting physician notes for further details and justification.  Depending on relief would recommend MRI versus CT scan.   ROS Otherwise per HPI.  Assessment & Plan: Visit Diagnoses:    ICD-10-CM   1. Lumbar radiculopathy  M54.16 XR C-ARM NO REPORT    Epidural Steroid injection    betamethasone acetate-betamethasone sodium phosphate (CELESTONE) injection 12 mg    Plan: No additional findings.   Meds & Orders:  Meds ordered this encounter  Medications  . betamethasone acetate-betamethasone sodium phosphate (CELESTONE) injection 12 mg    Orders Placed This Encounter  Procedures  . XR C-ARM NO REPORT  . Epidural Steroid injection    Follow-up: Return if symptoms worsen or fail to improve.   Procedures: No procedures performed  Lumbar Epidural Steroid Injection - Interlaminar Approach with Fluoroscopic Guidance  Patient: Paul Chase      Date of Birth: 12-01-1955 MRN: [id number] PCP: Laruth Bouchard, MD      Visit Date: 05/01/2020   Universal Protocol:     Consent Given By: the patient  Position: PRONE  Additional Comments: Vital signs were monitored before and after the procedure. Patient was prepped and draped in the usual sterile fashion. The correct patient, procedure, and site was verified.   Injection Procedure Details:   Procedure  diagnoses: Lumbar radiculopathy [M54.16]   Meds Administered:  Meds ordered this encounter  Medications  . betamethasone acetate-betamethasone sodium phosphate (CELESTONE) injection 12 mg     Laterality: Left  Location/Site:  L5-S1  Needle: 3.5 in., 20 ga. Tuohy  Needle Placement: Paramedian epidural  Findings:   -Comments: Excellent flow of contrast into the epidural space.  Procedure Details: Using a paramedian approach from the side mentioned above, the region overlying the inferior lamina was localized under fluoroscopic visualization and the soft tissues overlying this structure were infiltrated with 4 ml. of 1% Lidocaine without Epinephrine. The Tuohy needle was inserted into the epidural space using a paramedian approach.   The epidural space was localized using loss of resistance along with counter oblique bi-planar fluoroscopic views.  After negative aspirate for air, blood, and CSF, a 2 ml. volume of Isovue-250 was injected into the epidural space and the flow of contrast was observed. Radiographs were obtained for documentation purposes.    The injectate was administered into the level noted above.   Additional Comments:  The patient tolerated the procedure well Dressing: 2 x 2 sterile gauze and Band-Aid    Post-procedure details: Patient was observed during the procedure. Post-procedure instructions were reviewed.  Patient left the clinic in stable condition.     Clinical History: 04/11/2020 AP and lateral x-ray Radiographs of the lumbar spine show loss of disc space at L1 over L2.   Bony spurring L5 over S1. Degenerative changes. No acute finding.  Read by Dr. Lavada Mesi     Objective:  VS:  HT:    WT:   BMI:     BP:113/75  HR:(!) 105bpm  TEMP: ( )  RESP:  Physical Exam Vitals and nursing note reviewed.  Constitutional:      General: He is not in acute distress.    Appearance: Normal appearance. He is not ill-appearing.  HENT:     Head:  Normocephalic and atraumatic.     Right Ear: External ear normal.     Left Ear: External ear normal.     Nose: No congestion.  Eyes:     Extraocular Movements: Extraocular movements intact.  Cardiovascular:     Rate and Rhythm: Normal rate.     Pulses: Normal pulses.  Pulmonary:     Effort: Pulmonary effort is normal. No respiratory distress.  Abdominal:     General: There is no distension.     Palpations: Abdomen is soft.  Musculoskeletal:        General: No tenderness or signs of injury.     Cervical back: Neck supple.     Right lower leg: No edema.     Left lower leg: No edema.     Comments: Patient has good distal strength without clonus.  Skin:    Findings: No erythema or rash.  Neurological:     General: No focal deficit present.     Mental Status: He is alert and oriented to person, place, and time.     Sensory: No sensory deficit.     Motor: No weakness or abnormal muscle tone.     Coordination: Coordination normal.  Psychiatric:        Mood and Affect: Mood normal.        Behavior: Behavior normal.      Imaging: No results found.

## 2020-08-29 ENCOUNTER — Encounter: Payer: Self-pay | Admitting: Podiatry

## 2020-08-29 ENCOUNTER — Other Ambulatory Visit: Payer: Self-pay

## 2020-08-29 ENCOUNTER — Ambulatory Visit (INDEPENDENT_AMBULATORY_CARE_PROVIDER_SITE_OTHER): Payer: Medicare Other | Admitting: Podiatry

## 2020-08-29 DIAGNOSIS — E119 Type 2 diabetes mellitus without complications: Secondary | ICD-10-CM | POA: Diagnosis not present

## 2020-08-29 DIAGNOSIS — M2042 Other hammer toe(s) (acquired), left foot: Secondary | ICD-10-CM

## 2020-08-29 DIAGNOSIS — M2041 Other hammer toe(s) (acquired), right foot: Secondary | ICD-10-CM

## 2020-08-29 DIAGNOSIS — Z794 Long term (current) use of insulin: Secondary | ICD-10-CM | POA: Diagnosis not present

## 2020-09-02 ENCOUNTER — Encounter: Payer: Self-pay | Admitting: Podiatry

## 2020-09-02 NOTE — Progress Notes (Signed)
Subjective: Paul Chase presents today referred by System, Provider Not In for diabetic foot evaluation.  Patient relates less than 1 year history of diabetes.  Patient denies any history of foot wounds.  Patient denies any history of numbness, tingling, burning, pins/needles sensations.  Past Medical History:  Diagnosis Date   BPH (benign prostatic hyperplasia)    Diabetes mellitus without complication (HCC)    type 2   Foley catheter in place    due to BPH   Glaucoma    Hyperlipidemia    Hypertension    Neuropathy    pt. denies   Stroke [facility] 2016   minor left side mild weakness    Patient Active Problem List   Diagnosis Date Noted   BPH with urinary obstruction 06/17/2017   Gross hematuria 04/15/2017   Urinary retention 04/14/2017   Cocaine abuse with cocaine-induced mood disorder (HCC) 12/03/2016   Alcohol abuse     Past Surgical History:  Procedure Laterality Date   APPENDECTOMY     EYE SURGERY     r eye cataract   TRANSURETHRAL RESECTION OF PROSTATE N/A 06/17/2017   Procedure: TRANSURETHRAL RESECTION OF THE PROSTATE (TURP) WITH CYSTOSCOPY;  Surgeon: Rene Paci, MD;  Location: [facility];  Service: Urology;  Laterality: N/A;    Current Outpatient Medications on File Prior to Visit  Medication Sig Dispense Refill   amLODipine (NORVASC) 5 MG tablet Take 1 tablet (5 mg total) by mouth daily. 30 tablet 0   aspirin EC 81 MG tablet Take 81 mg daily by mouth.     bisoprolol-hydrochlorothiazide (ZIAC) 5-6.25 MG tablet Take 1 tablet daily by mouth.     gabapentin (NEURONTIN) 300 MG capsule Take 1 capsule (300 mg total) by mouth 2 (two) times daily. 30 capsule 0   glimepiride (AMARYL) 4 MG tablet Take 4 mg daily with breakfast by mouth.     HYDROcodone-acetaminophen (NORCO) 5-325 MG tablet Take 1 tablet by mouth every 6 (six) hours as needed. 30 tablet 0   simvastatin (ZOCOR) 40 MG tablet Take 40 mg at bedtime by mouth.     tamsulosin (FLOMAX) 0.4 MG CAPS  capsule Take 1 capsule (0.4 mg total) by mouth daily. (Patient taking differently: Take 0.4 mg by mouth 2 (two) times daily.) 30 capsule 0   travoprost, benzalkonium, (TRAVATAN) 0.004 % ophthalmic solution Place 1 drop at bedtime into both eyes.     No current facility-administered medications on file prior to visit.     No Known Allergies  Social History   Occupational History   Not on file  Tobacco Use   Smoking status: Every Day    Packs/day: 0.50    Types: Cigarettes   Smokeless tobacco: Never  Vaping Use   Vaping Use: Never used  Substance and Sexual Activity   Alcohol use: Yes    Comment: rare   Drug use: Yes    Types: Cocaine    Comment: "as much as I can"  not in 10 years   Sexual activity: Yes    History reviewed. No pertinent family history.   There is no immunization history on file for this patient.  Review of systems: Positive Findings in bold print.  Constitutional:  chills, fatigue, fever, sweats, weight change Communication: Nurse, learning disability, sign Presenter, broadcasting, hand writing, iPad/Android device Head: headaches, head injury Eyes: changes in vision, eye pain, glaucoma, cataracts, macular degeneration, diplopia, glare,  light sensitivity, eyeglasses or contacts, blindness Ears nose mouth throat: hearing impaired, hearing aids,  ringing  in ears, deaf, sign language,  vertigo, nosebleeds,  rhinitis,  cold sores, snoring, swollen glands Cardiovascular: HTN, edema, arrhythmia, pacemaker in place, defibrillator in place, chest pain/tightness, chronic anticoagulation, blood clot, heart failure, MI Peripheral Vascular: leg cramps, varicose veins, blood clots, lymphedema, varicosities Respiratory:  asthma, difficulty breathing, denies congestion, SOB, wheezing, cough, emphysema Gastrointestinal: change in appetite or weight, abdominal pain, constipation, diarrhea, nausea, vomiting, vomiting blood, change in bowel habits, abdominal pain, jaundice, rectal bleeding,  hemorrhoids, GERD Genitourinary:  nocturia,  pain on urination, polyuria,  blood in urine, Foley catheter, urinary urgency, ESRD on hemodialysis Musculoskeletal: amputation, cramping, stiff joints, painful joints, decreased joint motion, fractures, OA, gout, hemiplegia, paraplegia, uses cane, wheelchair bound, uses walker, uses rollator Skin: +changes in toenails, color change, dryness, itching, mole changes,  rash, wound(s) Neurological: headaches, numbness in feet, paresthesias in feet, burning in feet, fainting,  seizures, change in speech, migraines, memory problems/poor historian, cerebral palsy, weakness, paralysis, CVA, TIA Endocrine: diabetes, hypothyroidism, hyperthyroidism,  goiter, dry mouth, flushing, heat intolerance, cold intolerance,  excessive thirst, denies polyuria,  nocturia Hematological:  easy bleeding, excessive bleeding, easy bruising, enlarged lymph nodes, on long term blood thinner, history of past transusions Allergy/immunological:  hives, eczema, frequent infections, multiple drug allergies, seasonal allergies, transplant recipient, multiple food allergies Psychiatric:  anxiety, depression, mood disorder, suicidal ideations, hallucinations, insomnia  Objective: There were no vitals filed for this visit. Vascular Examination: Capillary refill time less than 3 seconds x 10 digits.  Dorsalis pedis pulses palpable 2 out of 4.  Posterior tibial pulses palpable 2 out of 4.  Digital hair not present x 10 digits.  Skin temperature gradient WNL b/l.  Dermatological Examination: Skin with normal turgor, texture and tone b/l  Toenails 1-5 b/l discolored, thick, dystrophic with subungual debris and pain with palpation to nailbeds due to thickness of nails.  Musculoskeletal: Muscle strength 5/5 to all LE muscle groups.  Hammertoe contractures noted 2 through 5 bilaterally.  Mild pain on palpation  Neurological: Sensation intact with 10 gram monofilament.  Vibratory  sensation intact.  Assessment: NIDDM Encounter for diabetic foot examination  Plan: Discussed diabetic foot care principles. Literature dispensed on today. Patient to continue soft, supportive shoe gear daily. Patient to report any pedal injuries to medical professional immediately. Follow up one year. Patient/POA to call should there be a concern in the interim. Given that patient is a diabetic in the setting of hammertoe contractures leading to high risk of ulceration I believe patient will benefit from diabetic shoes.  He will be scheduled seizure for diabetic shoes

## 2020-09-22 ENCOUNTER — Other Ambulatory Visit: Payer: Medicare Other

## 2020-12-05 ENCOUNTER — Ambulatory Visit: Payer: Medicare Other | Admitting: Podiatry

## 2021-03-09 ENCOUNTER — Telehealth: Payer: Self-pay | Admitting: Physical Medicine and Rehabilitation

## 2021-03-09 NOTE — Telephone Encounter (Signed)
Patient called. He would like an appointment with Dr. Alvester Morin. His call back number is [phone]

## 2021-03-12 ENCOUNTER — Telehealth: Payer: Self-pay | Admitting: Physical Medicine and Rehabilitation

## 2021-03-12 NOTE — Telephone Encounter (Signed)
Patient called needing to schedule an appointment with Dr Alvester Morin. The number to contact patient is [phone]

## 2021-03-20 ENCOUNTER — Ambulatory Visit: Payer: Medicare Other | Admitting: Podiatry

## 2021-03-27 ENCOUNTER — Ambulatory Visit: Payer: Medicare Other | Admitting: Podiatry

## 2021-03-30 ENCOUNTER — Encounter: Payer: Self-pay | Admitting: Physical Medicine and Rehabilitation

## 2021-03-30 ENCOUNTER — Ambulatory Visit (INDEPENDENT_AMBULATORY_CARE_PROVIDER_SITE_OTHER): Payer: Medicare Other | Admitting: Physical Medicine and Rehabilitation

## 2021-03-30 ENCOUNTER — Ambulatory Visit: Payer: Self-pay

## 2021-03-30 ENCOUNTER — Other Ambulatory Visit: Payer: Self-pay

## 2021-03-30 VITALS — BP 136/79 | HR 75

## 2021-03-30 DIAGNOSIS — M5416 Radiculopathy, lumbar region: Secondary | ICD-10-CM

## 2021-03-30 MED ORDER — METHYLPREDNISOLONE ACETATE 80 MG/ML IJ SUSP
80.0000 mg | Freq: Once | INTRAMUSCULAR | Status: AC
  Administered 2021-03-30: 80 mg

## 2021-03-30 NOTE — Progress Notes (Signed)
? ?Paul Chase - 66 y.o. male MRN [id number]  Date of birth: [birth year] ? ?Office Visit Note: ?Visit Date: 03/30/2021 ?PCP: System, Provider Not In ?Referred by: Tyrell Antonio, MD ? ?Subjective: ?Chief Complaint  ?Patient presents with  ? Lower Back - Pain  ? Left Thigh - Pain  ? ?HPI:  Paul Chase is a 66 y.o. male who comes in today for planned repeat Left L5-S1  Lumbar Interlaminar epidural steroid injection with fluoroscopic guidance.  The patient has failed conservative care including home exercise, medications, time and activity modification.  This injection will be diagnostic and hopefully therapeutic.  Please see requesting physician notes for further details and justification. Patient received more than 50% pain relief from prior injection.  Last injection was in April 2022.  At the time we talked about if the results were good to obtain MRI of the lumbar spine.  He reports near complete relief for more than 8 months.  He reports no red flag symptoms today.  No bowel or bladder changes no focal weakness.  Continued pain is in a similar distribution no new trauma.  His case is complicated by type 2 diabetes. ? ?Referring: Barnie Del, FNP ? ?ROS Otherwise per HPI. ? ?Assessment & Plan: ?Visit Diagnoses:  ?  ICD-10-CM   ?1. Lumbar radiculopathy  M54.16 XR C-ARM NO REPORT  ?  Epidural Steroid injection  ?  methylPREDNISolone acetate (DEPO-MEDROL) injection 80 mg  ?  ?  ?Plan: No additional findings.  ? ?Meds & Orders:  ?Meds ordered this encounter  ?Medications  ? methylPREDNISolone acetate (DEPO-MEDROL) injection 80 mg  ?  ?Orders Placed This Encounter  ?Procedures  ? XR C-ARM NO REPORT  ? Epidural Steroid injection  ?  ?Follow-up: Return for Consider lumbar MRI.  ? ?Procedures: ?No procedures performed  ?Lumbar Epidural Steroid Injection - Interlaminar Approach with Fluoroscopic Guidance ? ?Patient: Paul Chase      ?Date of Birth: [birth year] ?MRN: [id number] ?PCP: System, Provider Not In      ?Visit  Date: 03/30/2021 ?  ?Universal Protocol:    ? ?Consent Given By: the patient ? ?Position: PRONE ? ?Additional Comments: ?Vital signs were monitored before and after the procedure. ?Patient was prepped and draped in the usual sterile fashion. ?The correct patient, procedure, and site was verified. ? ? ?Injection Procedure Details:  ? ?Procedure diagnoses: Lumbar radiculopathy [M54.16]  ? ?Meds Administered:  ?Meds ordered this encounter  ?Medications  ? methylPREDNISolone acetate (DEPO-MEDROL) injection 80 mg  ?  ? ?Laterality: Left ? ?Location/Site:  L5-S1 ? ?Needle: 3.5 in., 20 ga. Tuohy ? ?Needle Placement: Paramedian epidural ? ?Findings:  ? -Comments: Excellent flow of contrast into the epidural space. ? ?Procedure Details: ?Using a paramedian approach from the side mentioned above, the region overlying the inferior lamina was localized under fluoroscopic visualization and the soft tissues overlying this structure were infiltrated with 4 ml. of 1% Lidocaine without Epinephrine. The Tuohy needle was inserted into the epidural space using a paramedian approach.  ? ?The epidural space was localized using loss of resistance along with counter oblique bi-planar fluoroscopic views.  After negative aspirate for air, blood, and CSF, a 2 ml. volume of Isovue-250 was injected into the epidural space and the flow of contrast was observed. Radiographs were obtained for documentation purposes.   ? ?The injectate was administered into the level noted above. ? ? ?Additional Comments:  ?The patient tolerated the procedure well ?Dressing: 2 x 2 sterile gauze and Band-Aid ?  ? ?  Post-procedure details: ?Patient was observed during the procedure. ?Post-procedure instructions were reviewed. ? ?Patient left the clinic in stable condition.  ? ?Clinical History: ?04/11/2020 AP and lateral x-ray ?Radiographs of the lumbar spine show loss of disc space at L1 over L2.    ?Bony spurring L5 over S1.  Degenerative changes.  No acute  finding. ? ?Read by Dr. Lavada Mesi  ? ? ? ?Objective:  VS:  HT:    WT:   BMI:     BP:136/79  HR:75bpm  TEMP: ( )  RESP:  ?Physical Exam ?Vitals and nursing note reviewed.  ?Constitutional:   ?   General: He is not in acute distress. ?   Appearance: Normal appearance. He is not ill-appearing.  ?HENT:  ?   Head: Normocephalic and atraumatic.  ?   Right Ear: External ear normal.  ?   Left Ear: External ear normal.  ?   Nose: No congestion.  ?Eyes:  ?   Extraocular Movements: Extraocular movements intact.  ?Cardiovascular:  ?   Rate and Rhythm: Normal rate.  ?   Pulses: Normal pulses.  ?Pulmonary:  ?   Effort: Pulmonary effort is normal. No respiratory distress.  ?Abdominal:  ?   General: There is no distension.  ?   Palpations: Abdomen is soft.  ?Musculoskeletal:     ?   General: No tenderness or signs of injury.  ?   Cervical back: Neck supple.  ?   Right lower leg: No edema.  ?   Left lower leg: No edema.  ?   Comments: Patient has good distal strength without clonus.  ?Skin: ?   Findings: No erythema or rash.  ?Neurological:  ?   General: No focal deficit present.  ?   Mental Status: He is alert and oriented to person, place, and time.  ?   Sensory: No sensory deficit.  ?   Motor: No weakness or abnormal muscle tone.  ?   Coordination: Coordination normal.  ?Psychiatric:     ?   Mood and Affect: Mood normal.     ?   Behavior: Behavior normal.  ?  ? ?Imaging: ?XR C-ARM NO REPORT ? ?Result Date: 03/30/2021 ?Please see Notes tab for imaging impression.  ?

## 2021-03-30 NOTE — Progress Notes (Signed)
Pt state lower back pain tha travels to his left thigh. Pt state laying down and sitting makes the pain worse. Pt sate he take pain meds to help ease the pain. ? ?Numeric Pain Rating Scale and Functional Assessment ?Average Pain 7 ? ? ?In the last MONTH (on 0-10 scale) has pain interfered with the following? ? ?1. General activity like being  able to carry out your everyday physical activities such as walking, climbing stairs, carrying groceries, or moving a chair?  ?Rating(10) ? ? ?+Driver, -BT, -Dye Allergies. ? ?

## 2021-03-30 NOTE — Procedures (Signed)
Lumbar Epidural Steroid Injection - Interlaminar Approach with Fluoroscopic Guidance ? ?Patient: Paul Chase      ?Date of Birth: Feb 25, 1955 ?MRN: [id number] ?PCP: System, Provider Not In      ?Visit Date: 03/30/2021 ?  ?Universal Protocol:    ? ?Consent Given By: the patient ? ?Position: PRONE ? ?Additional Comments: ?Vital signs were monitored before and after the procedure. ?Patient was prepped and draped in the usual sterile fashion. ?The correct patient, procedure, and site was verified. ? ? ?Injection Procedure Details:  ? ?Procedure diagnoses: Lumbar radiculopathy [M54.16]  ? ?Meds Administered:  ?Meds ordered this encounter  ?Medications  ? methylPREDNISolone acetate (DEPO-MEDROL) injection 80 mg  ?  ? ?Laterality: Left ? ?Location/Site:  L5-S1 ? ?Needle: 3.5 in., 20 ga. Tuohy ? ?Needle Placement: Paramedian epidural ? ?Findings:  ? -Comments: Excellent flow of contrast into the epidural space. ? ?Procedure Details: ?Using a paramedian approach from the side mentioned above, the region overlying the inferior lamina was localized under fluoroscopic visualization and the soft tissues overlying this structure were infiltrated with 4 ml. of 1% Lidocaine without Epinephrine. The Tuohy needle was inserted into the epidural space using a paramedian approach.  ? ?The epidural space was localized using loss of resistance along with counter oblique bi-planar fluoroscopic views.  After negative aspirate for air, blood, and CSF, a 2 ml. volume of Isovue-250 was injected into the epidural space and the flow of contrast was observed. Radiographs were obtained for documentation purposes.   ? ?The injectate was administered into the level noted above. ? ? ?Additional Comments:  ?The patient tolerated the procedure well ?Dressing: 2 x 2 sterile gauze and Band-Aid ?  ? ?Post-procedure details: ?Patient was observed during the procedure. ?Post-procedure instructions were reviewed. ? ?Patient left the clinic in stable  condition. ?

## 2021-03-30 NOTE — Patient Instructions (Signed)

## 2021-04-13 ENCOUNTER — Other Ambulatory Visit: Payer: Self-pay | Admitting: Nephrology

## 2021-04-13 ENCOUNTER — Other Ambulatory Visit: Payer: Self-pay | Admitting: Internal Medicine

## 2021-04-13 DIAGNOSIS — N2581 Secondary hyperparathyroidism of renal origin: Secondary | ICD-10-CM

## 2021-04-13 DIAGNOSIS — I129 Hypertensive chronic kidney disease with stage 1 through stage 4 chronic kidney disease, or unspecified chronic kidney disease: Secondary | ICD-10-CM

## 2021-04-13 DIAGNOSIS — D631 Anemia in chronic kidney disease: Secondary | ICD-10-CM

## 2021-04-13 DIAGNOSIS — N1831 Chronic kidney disease, stage 3a: Secondary | ICD-10-CM

## 2021-04-17 ENCOUNTER — Ambulatory Visit: Payer: Medicare Other | Admitting: Podiatry

## 2021-04-21 ENCOUNTER — Ambulatory Visit
Admission: RE | Admit: 2021-04-21 | Discharge: 2021-04-21 | Disposition: A | Discharge status: Home / Self Care | Payer: Medicare Other | Source: Ambulatory Visit | Attending: Nephrology | Admitting: Nephrology

## 2021-04-21 DIAGNOSIS — D631 Anemia in chronic kidney disease: Secondary | ICD-10-CM

## 2021-04-21 DIAGNOSIS — I129 Hypertensive chronic kidney disease with stage 1 through stage 4 chronic kidney disease, or unspecified chronic kidney disease: Secondary | ICD-10-CM

## 2021-04-21 DIAGNOSIS — N1831 Chronic kidney disease, stage 3a: Secondary | ICD-10-CM

## 2021-04-21 DIAGNOSIS — N2581 Secondary hyperparathyroidism of renal origin: Secondary | ICD-10-CM

## 2021-05-08 ENCOUNTER — Ambulatory Visit: Payer: Medicare Other | Admitting: Podiatry

## 2021-06-01 IMAGING — US US EXTREM LOW VENOUS*L*
1 series · 14 of 24 positions shown · non-contrast
Comparison: None.

CLINICAL DATA: Left thigh pain.

EXAM:
Left LOWER EXTREMITY VENOUS DOPPLER ULTRASOUND
TECHNIQUE: Gray-scale sonography with compression, as well as color and duplex
ultrasound, were performed to evaluate the deep venous system(s)
from the level of the common femoral vein through the popliteal and
proximal calf veins.

[Series 1: us extrem low venous*left* · 0.07mm/px · 14 of 38 slices shown]
[im 1/38]
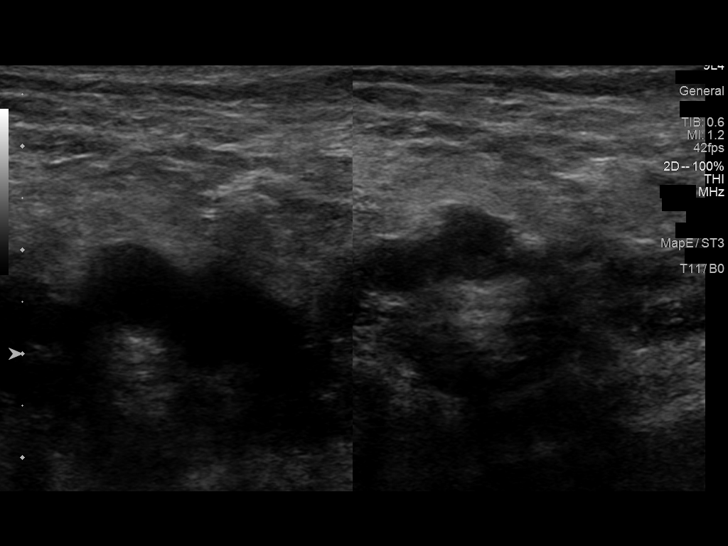
[im 4/38]
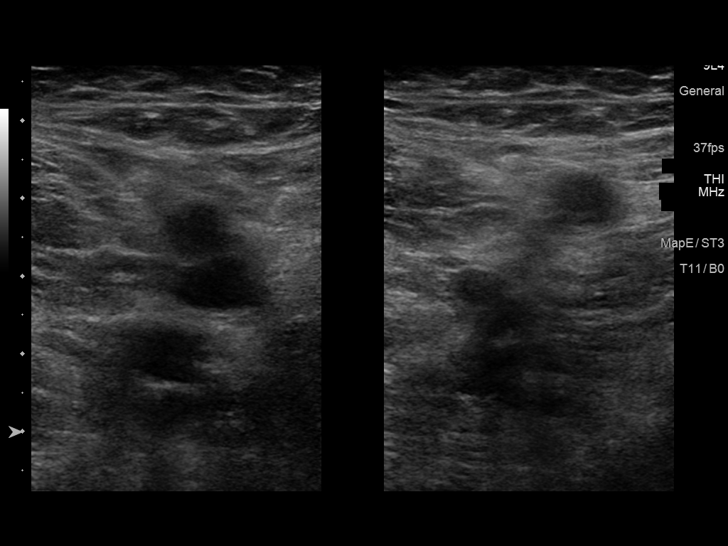
[im 7/38]
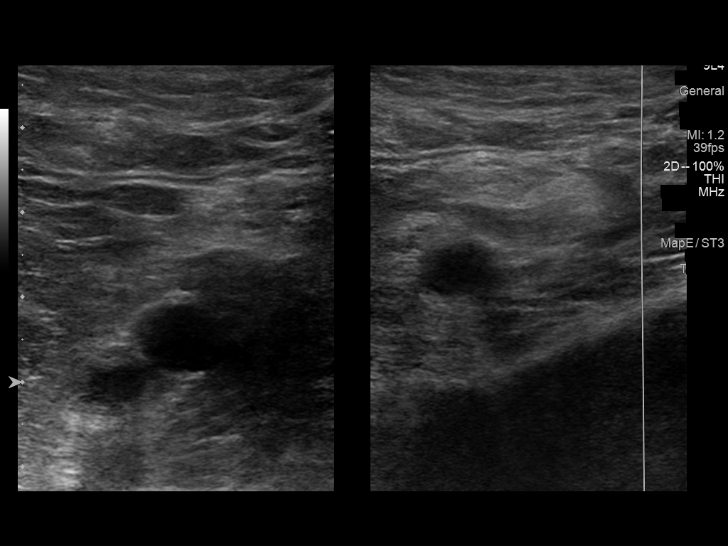
[im 10/38]
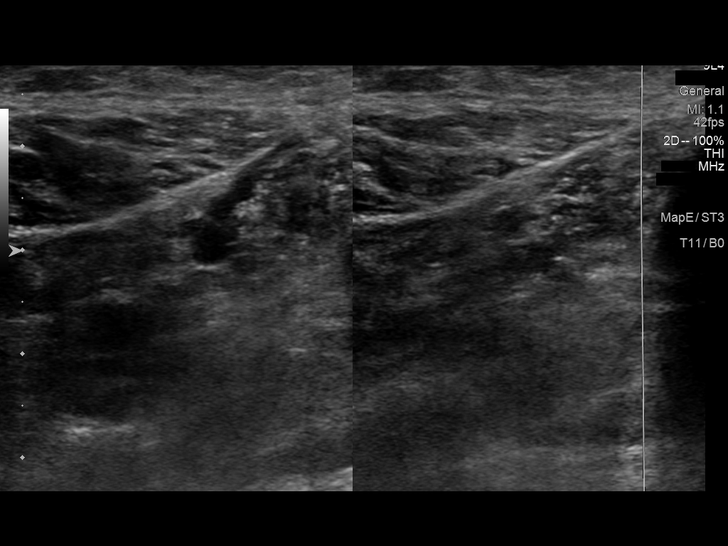
[im 12/38]
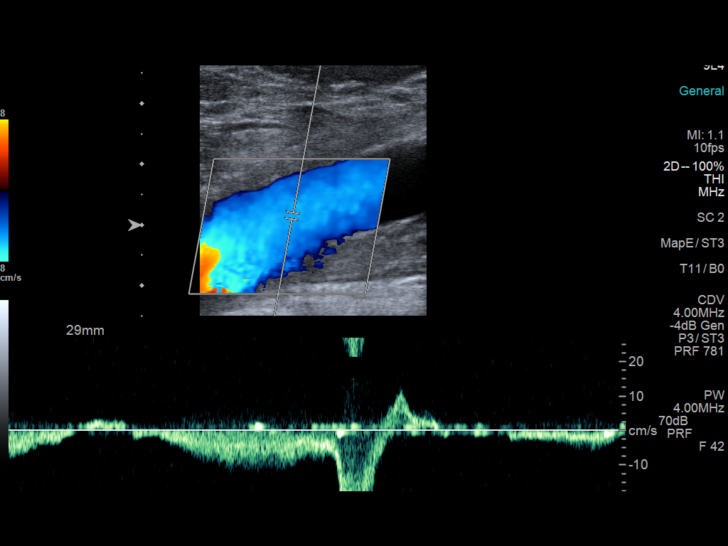
[im 15/38]
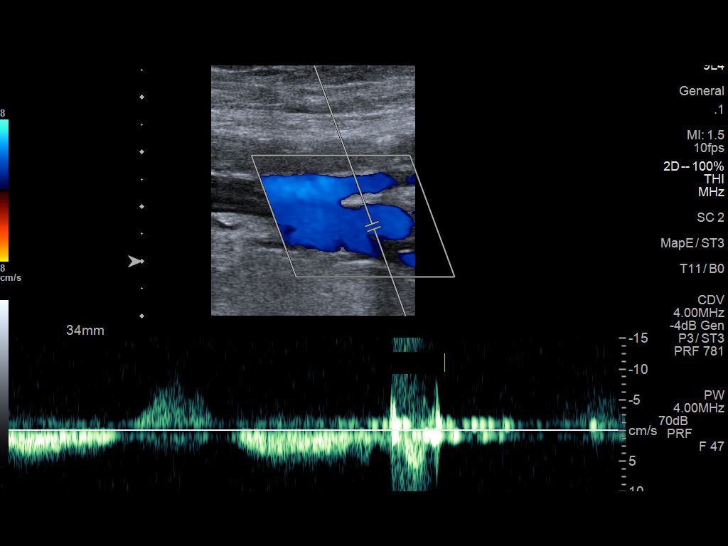
[im 18/38]
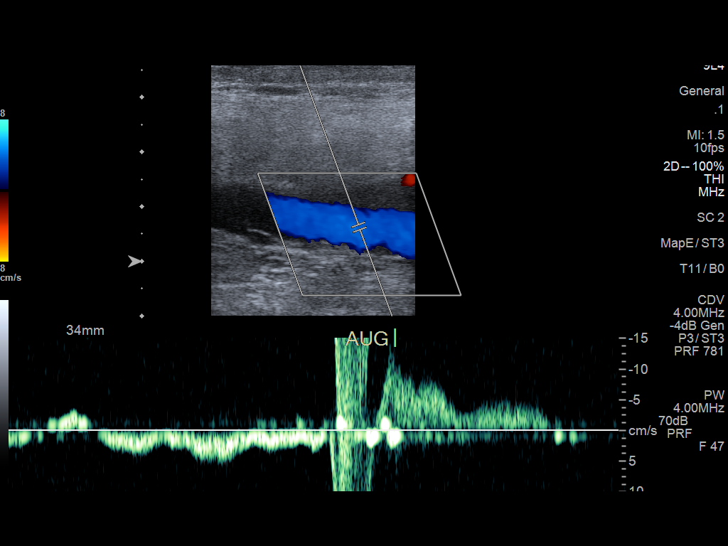
[im 20/38]
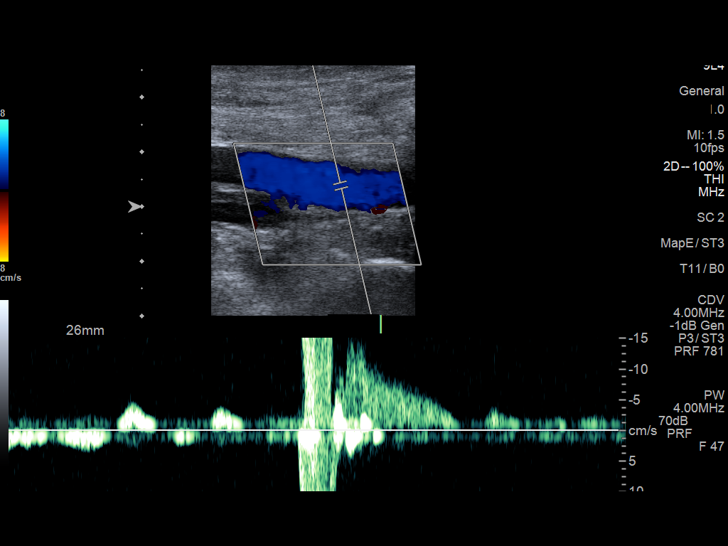
[im 23/38]
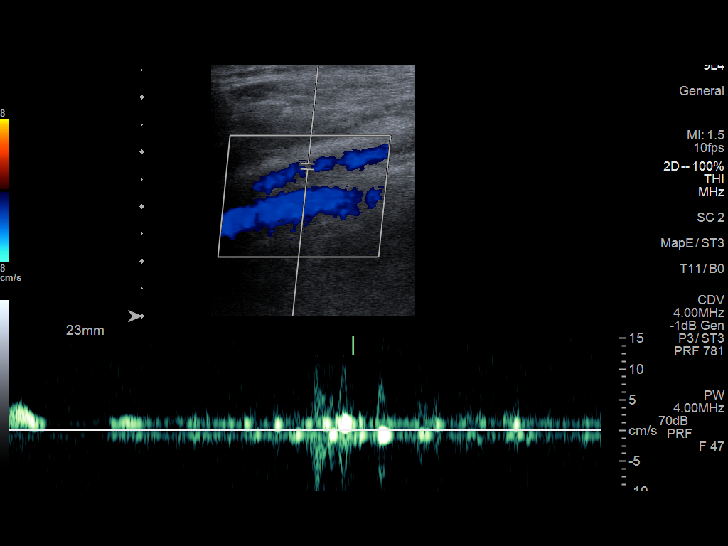
[im 26/38]
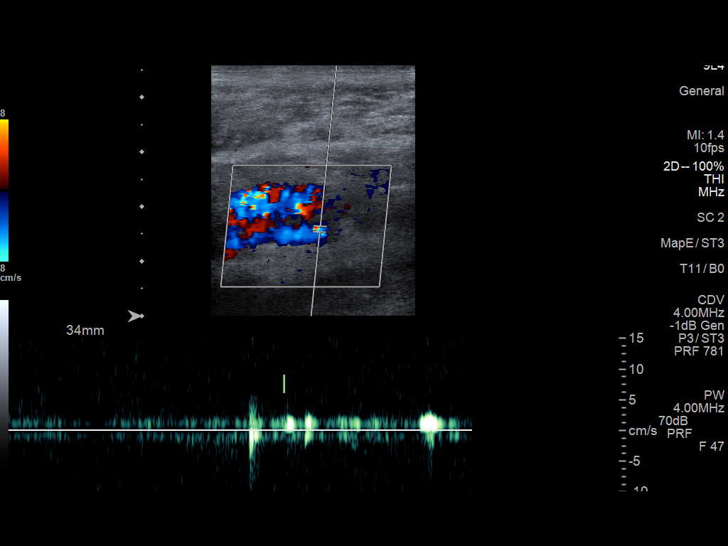
[im 29/38]
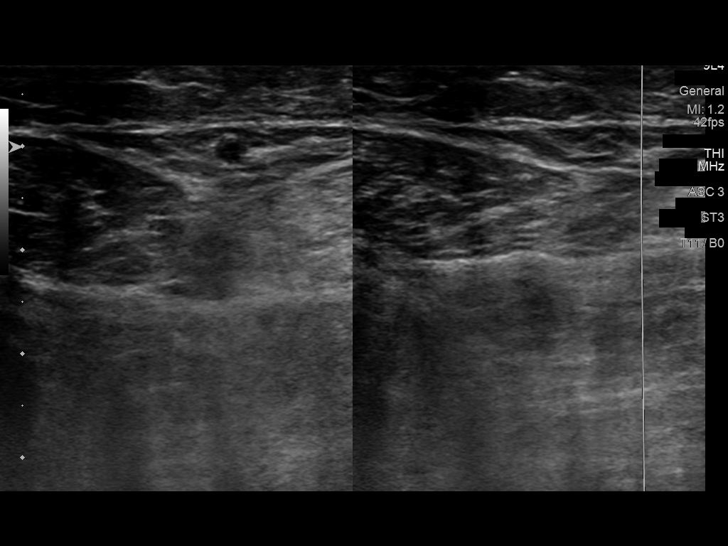
[im 31/38]
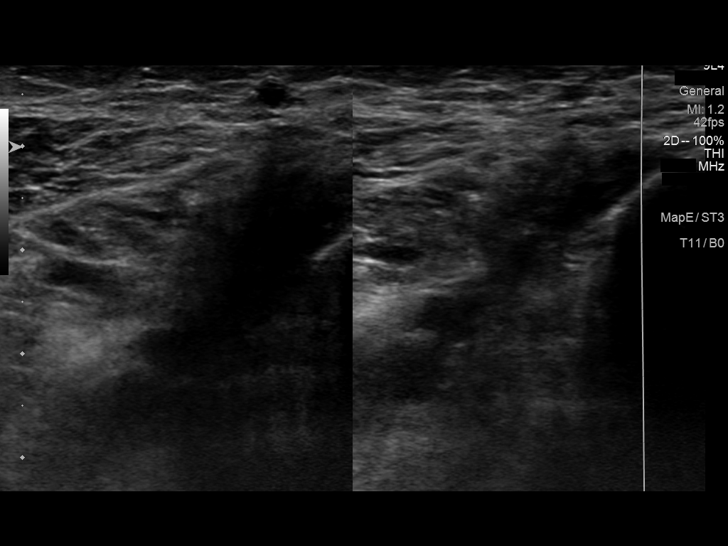
[im 34/38]
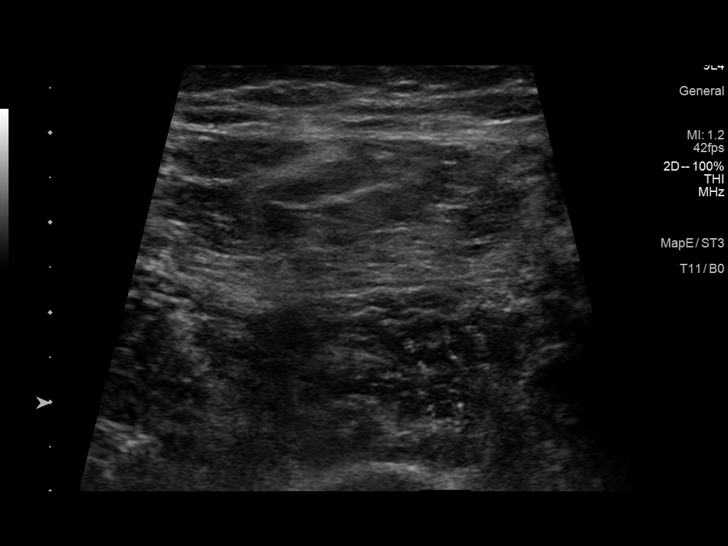
[im 38/38]
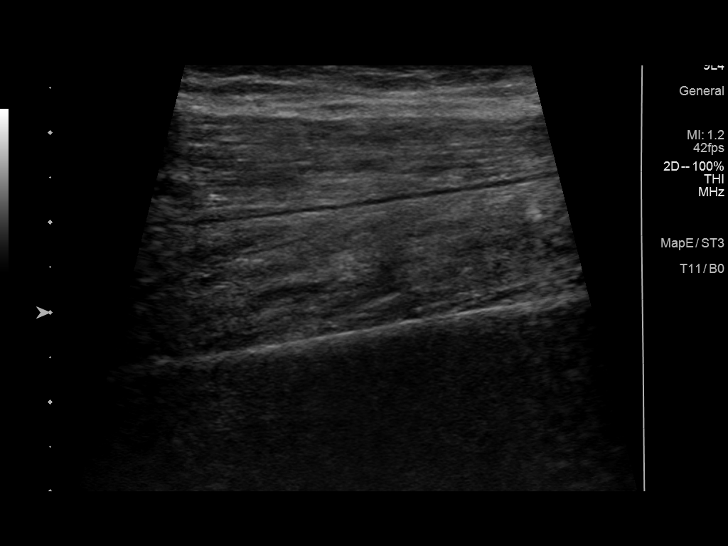

[14 of 24 positions shown; findings below may reference images not displayed]

FINDINGS: VENOUS

Normal compressibility of the common femoral, superficial femoral,
and popliteal veins, as well as the visualized calf veins.
Visualized portions of profunda femoral vein and great saphenous
vein unremarkable. No filling defects to suggest DVT on grayscale or
color Doppler imaging. Doppler waveforms show normal direction of
venous flow, normal respiratory plasticity and response to
augmentation.

Limited views of the contralateral common femoral vein are
unremarkable.

OTHER

None.

Limitations: none
IMPRESSION: Negative.

## 2021-07-29 ENCOUNTER — Other Ambulatory Visit (HOSPITAL_COMMUNITY): Payer: Self-pay | Admitting: Family Medicine

## 2021-07-29 DIAGNOSIS — R059 Cough, unspecified: Secondary | ICD-10-CM

## 2021-08-04 ENCOUNTER — Other Ambulatory Visit (HOSPITAL_COMMUNITY): Payer: Self-pay | Admitting: Family Medicine

## 2021-08-04 DIAGNOSIS — I739 Peripheral vascular disease, unspecified: Secondary | ICD-10-CM

## 2021-08-06 ENCOUNTER — Ambulatory Visit
Admission: RE | Admit: 2021-08-06 | Discharge: 2021-08-06 | Disposition: A | Discharge status: Home / Self Care | Payer: Medicare Other | Source: Ambulatory Visit | Attending: Family Medicine | Admitting: Family Medicine

## 2021-08-06 DIAGNOSIS — R059 Cough, unspecified: Secondary | ICD-10-CM

## 2021-08-10 ENCOUNTER — Ambulatory Visit (HOSPITAL_COMMUNITY)
Admission: RE | Admit: 2021-08-10 | Discharge: 2021-08-10 | Disposition: A | Discharge status: Home / Self Care | Payer: Medicare Other | Source: Ambulatory Visit | Attending: Family Medicine | Admitting: Family Medicine

## 2021-08-10 DIAGNOSIS — I739 Peripheral vascular disease, unspecified: Secondary | ICD-10-CM | POA: Insufficient documentation

## 2021-08-10 NOTE — Progress Notes (Signed)
VASCULAR LAB    ABI has been performed.  See CV proc for preliminary results.   Dezmen Alcock, RVT 08/10/2021, 9:36 AM

## 2021-11-19 LAB — COLOGUARD: COLOGUARD: NEGATIVE

## 2021-12-28 ENCOUNTER — Emergency Department (HOSPITAL_COMMUNITY)
Admission: EM | Admit: 2021-12-28 | Discharge: 2021-12-29 | Discharge status: Against Medical Advice | Payer: Medicare Other | Attending: Emergency Medicine | Admitting: Emergency Medicine

## 2021-12-28 ENCOUNTER — Encounter (HOSPITAL_COMMUNITY): Payer: Self-pay

## 2021-12-28 DIAGNOSIS — E162 Hypoglycemia, unspecified: Secondary | ICD-10-CM | POA: Diagnosis present

## 2021-12-28 LAB — CBC WITH DIFFERENTIAL/PLATELET
Abs Immature Granulocytes: 0.02 10*3/uL (ref 0.00–0.07)
Basophils Absolute: 0.1 10*3/uL (ref 0.0–0.1)
Basophils Relative: 1 %
Eosinophils Absolute: 0.2 10*3/uL (ref 0.0–0.5)
Eosinophils Relative: 2 %
HCT: 37.2 % — ABNORMAL LOW (ref 39.0–52.0)
Hemoglobin: 12.2 g/dL — ABNORMAL LOW (ref 13.0–17.0)
Immature Granulocytes: 0 %
Lymphocytes Relative: 26 %
Lymphs Abs: 2.5 10*3/uL (ref 0.7–4.0)
MCH: 28.2 pg (ref 26.0–34.0)
MCHC: 32.8 g/dL (ref 30.0–36.0)
MCV: 86.1 fL (ref 80.0–100.0)
Monocytes Absolute: 0.9 10*3/uL (ref 0.1–1.0)
Monocytes Relative: 9 %
Neutro Abs: 5.8 10*3/uL (ref 1.7–7.7)
Neutrophils Relative %: 62 %
Platelets: 388 10*3/uL (ref 150–400)
RBC: 4.32 MIL/uL (ref 4.22–5.81)
RDW: 15.1 % (ref 11.5–15.5)
WBC: 9.5 10*3/uL (ref 4.0–10.5)
nRBC: 0 % (ref 0.0–0.2)

## 2021-12-28 LAB — COMPREHENSIVE METABOLIC PANEL
ALT: 13 U/L (ref 0–44)
AST: 12 U/L — ABNORMAL LOW (ref 15–41)
Albumin: 3.3 g/dL — ABNORMAL LOW (ref 3.5–5.0)
Alkaline Phosphatase: 50 U/L (ref 38–126)
Anion gap: 7 (ref 5–15)
BUN: 8 mg/dL (ref 8–23)
CO2: 27 mmol/L (ref 22–32)
Calcium: 8.7 mg/dL — ABNORMAL LOW (ref 8.9–10.3)
Chloride: 96 mmol/L — ABNORMAL LOW (ref 98–111)
Creatinine, Ser: 1.33 mg/dL — ABNORMAL HIGH (ref 0.61–1.24)
GFR, Estimated: 58 mL/min — ABNORMAL LOW (ref >60)
Glucose, Bld: 105 mg/dL — ABNORMAL HIGH (ref 70–99)
Potassium: 4.3 mmol/L (ref 3.5–5.1)
Sodium: 130 mmol/L — ABNORMAL LOW (ref 135–145)
Total Bilirubin: 0.4 mg/dL (ref 0.3–1.2)
Total Protein: 7.1 g/dL (ref 6.5–8.1)

## 2021-12-28 LAB — CBG MONITORING, ED
Glucose-Capillary: 105 mg/dL — ABNORMAL HIGH (ref 70–99)
Glucose-Capillary: 162 mg/dL — ABNORMAL HIGH (ref 70–99)

## 2021-12-28 NOTE — ED Provider Triage Note (Signed)
Emergency Medicine Provider Triage Evaluation Note  Paul Chase , a 66 y.o. male  was evaluated in triage.  Pt complains of hypoglycemia.   Takes Trulicity.  Has had generalized weakness over the last few days.  Overall decreased appetite.  He has been checking his blood sugars which have been low at home.  Lowest 38. No pain  Review of Systems  Positive: Hypoglycemic  Negative: Fever, emesis, CP, SOB, numbness  Physical Exam  BP 128/71   Pulse 85   Temp 97.8 F (36.6 C) (Oral)   Resp 18   SpO2 100%  Gen:   Awake, no distress   Resp:  Normal effort  MSK:   Moves extremities without difficulty  Other:    Medical Decision Making  Medically screening exam initiated at 4:26 PM.  Appropriate orders placed.  Greco Gastelum was informed that the remainder of the evaluation will be completed by another provider, this initial triage assessment does not replace that evaluation, and the importance of remaining in the ED until their evaluation is complete.  Hypoglycemia   Cordae Mccarey A, PA-C 12/28/21 1629

## 2021-12-28 NOTE — ED Triage Notes (Signed)
Pt arrived via POV, states he has been having trouble keeping his blood sugar up. States appetite has been low for the last two weeks.

## 2021-12-29 LAB — CBG MONITORING, ED
Glucose-Capillary: 108 mg/dL — ABNORMAL HIGH (ref 70–99)
Glucose-Capillary: 112 mg/dL — ABNORMAL HIGH (ref 70–99)
Glucose-Capillary: 125 mg/dL — ABNORMAL HIGH (ref 70–99)

## 2021-12-29 NOTE — ED Notes (Signed)
Pt and visitor requesting CBG check. During interaction, pt's visitor became verbally aggressive about wait times. She asked this writer how long until they would get room and see EDP. I advised that we are trying to get people back and seen as quickly as possible, however we do not have rooms at the moment. Visitor threw her hand up in my face and said they were "tired of hearing that". This Clinical research associate again explained that staff is working as quickly as possible to get pts seen. Triage RN aware.

## 2022-04-21 ENCOUNTER — Other Ambulatory Visit (HOSPITAL_BASED_OUTPATIENT_CLINIC_OR_DEPARTMENT_OTHER): Payer: Self-pay

## 2022-04-21 DIAGNOSIS — R0609 Other forms of dyspnea: Secondary | ICD-10-CM

## 2022-04-22 ENCOUNTER — Ambulatory Visit (INDEPENDENT_AMBULATORY_CARE_PROVIDER_SITE_OTHER): Payer: 59 | Admitting: Internal Medicine

## 2022-04-22 DIAGNOSIS — R0609 Other forms of dyspnea: Secondary | ICD-10-CM

## 2022-04-22 LAB — PULMONARY FUNCTION TEST
DL/VA % pred: 113 %
DL/VA: 4.57 ml/min/mmHg/L
DLCO cor % pred: 82 %
DLCO cor: 23.71 ml/min/mmHg
DLCO unc % pred: 82 %
DLCO unc: 23.71 ml/min/mmHg
FEF 25-75 Post: 2.41 L/sec
FEF 25-75 Pre: 1.88 L/sec
FEF2575-%Change-Post: 28 %
FEF2575-%Pred-Post: 84 %
FEF2575-%Pred-Pre: 65 %
FEV1-%Change-Post: 5 %
FEV1-%Pred-Post: 69 %
FEV1-%Pred-Pre: 66 %
FEV1-Post: 2.58 L
FEV1-Pre: 2.46 L
FEV1FVC-%Change-Post: -2 %
FEV1FVC-%Pred-Pre: 102 %
FEV6-%Change-Post: 6 %
FEV6-%Pred-Post: 71 %
FEV6-%Pred-Pre: 66 %
FEV6-Post: 3.39 L
FEV6-Pre: 3.17 L
FEV6FVC-%Change-Post: 0 %
FEV6FVC-%Pred-Post: 102 %
FEV6FVC-%Pred-Pre: 102 %
FVC-%Change-Post: 7 %
FVC-%Pred-Post: 69 %
FVC-%Pred-Pre: 64 %
FVC-Post: 3.49 L
FVC-Pre: 3.25 L
Post FEV1/FVC ratio: 74 %
Post FEV6/FVC ratio: 97 %
Pre FEV1/FVC ratio: 76 %
Pre FEV6/FVC Ratio: 97 %
RV % pred: 110 %
RV: 2.84 L
TLC % pred: 80 %
TLC: 6.16 L

## 2022-04-22 NOTE — Progress Notes (Signed)
Full PFT Performed Today  

## 2022-04-22 NOTE — Patient Instructions (Signed)
Full PFT Performed Today  

## 2022-05-17 ENCOUNTER — Other Ambulatory Visit: Payer: Self-pay | Admitting: Nurse Practitioner

## 2022-05-17 DIAGNOSIS — F17210 Nicotine dependence, cigarettes, uncomplicated: Secondary | ICD-10-CM

## 2022-05-17 DIAGNOSIS — R053 Chronic cough: Secondary | ICD-10-CM

## 2022-05-28 ENCOUNTER — Ambulatory Visit
Admission: RE | Admit: 2022-05-28 | Discharge: 2022-05-28 | Disposition: A | Discharge status: Home / Self Care | Payer: 59 | Source: Ambulatory Visit | Attending: Nurse Practitioner | Admitting: Nurse Practitioner

## 2022-05-28 DIAGNOSIS — F17210 Nicotine dependence, cigarettes, uncomplicated: Secondary | ICD-10-CM

## 2022-05-28 DIAGNOSIS — R053 Chronic cough: Secondary | ICD-10-CM

## 2022-06-22 ENCOUNTER — Other Ambulatory Visit: Payer: 59

## 2022-07-20 IMAGING — US US RENAL
1 series · 14 of 25 positions shown · non-contrast
Comparison: Prior CT from 04/14/2017.

CLINICAL DATA: Initial evaluation for stage III A chronic kidney
disease.

EXAM:
RENAL / URINARY TRACT ULTRASOUND COMPLETE

[Series 1: us renal · 0.25mm/px · 14 of 41 slices shown]
[im 1/41]
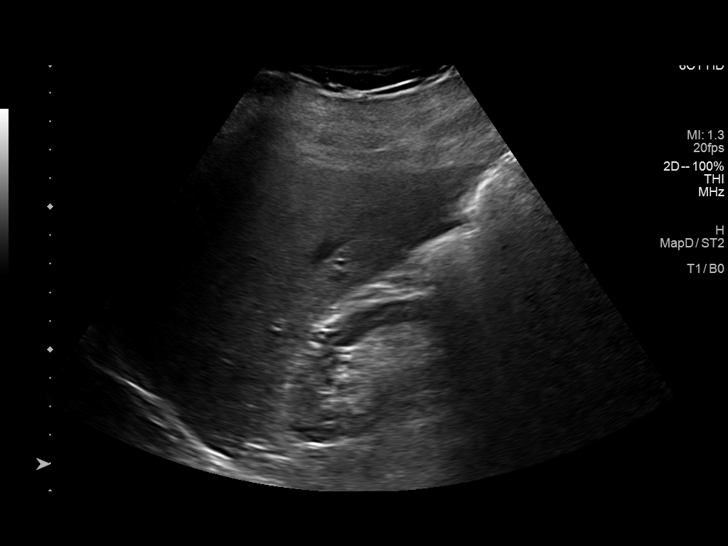
[im 4/41]
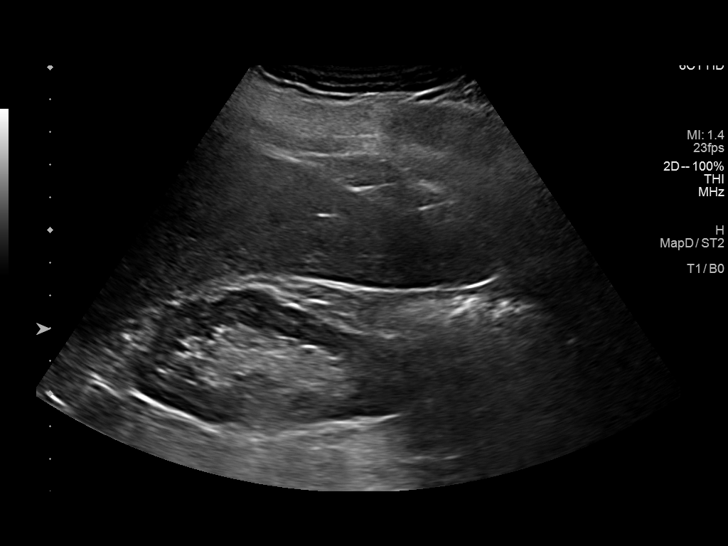
[im 7/41]
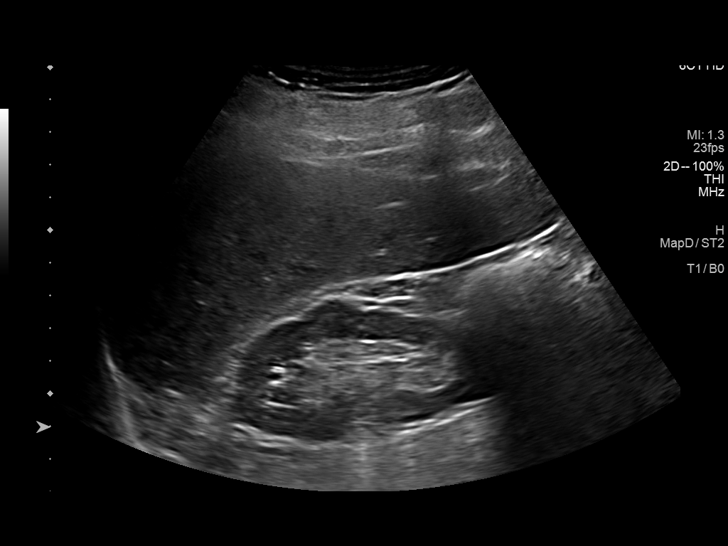
[im 11/41]
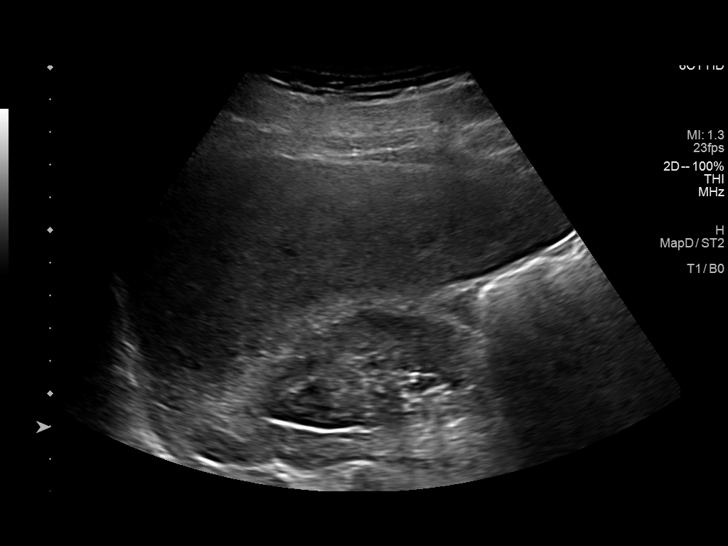
[im 14/41]
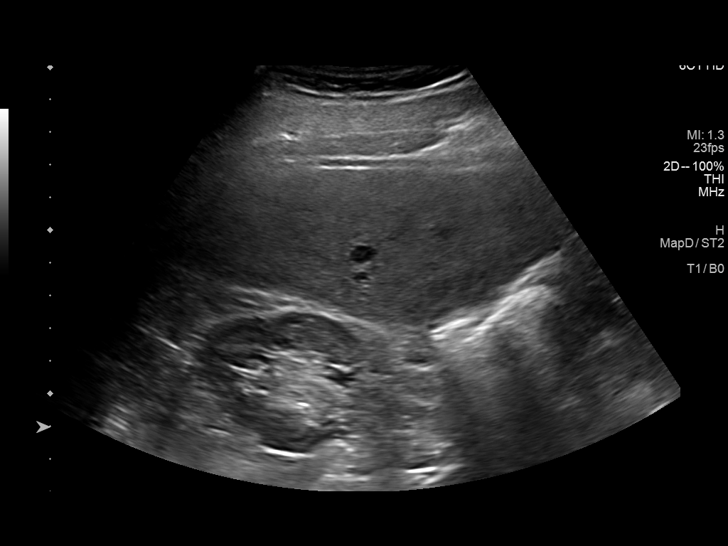
[im 16/41]
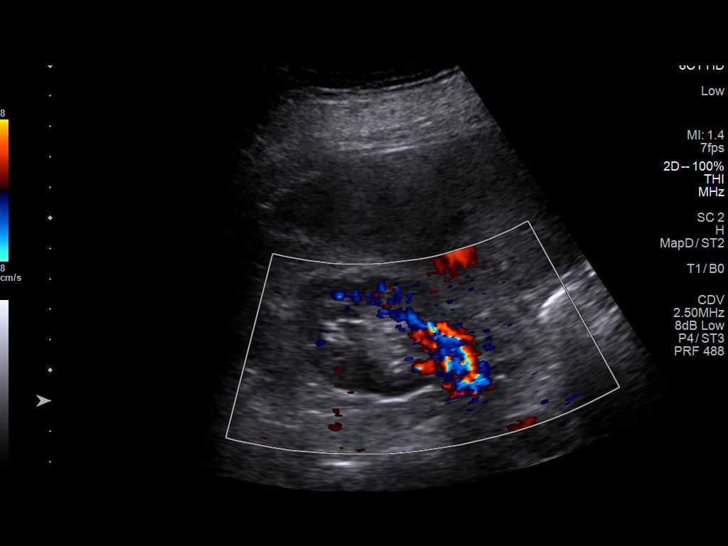
[im 19/41]
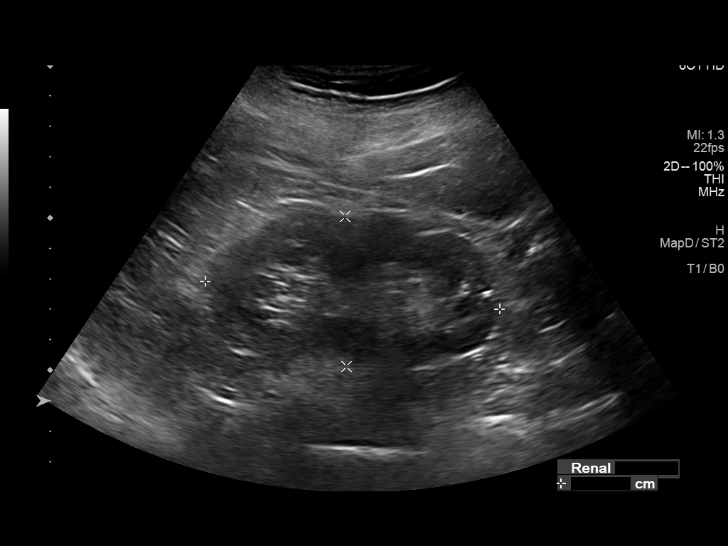
[im 22/41]
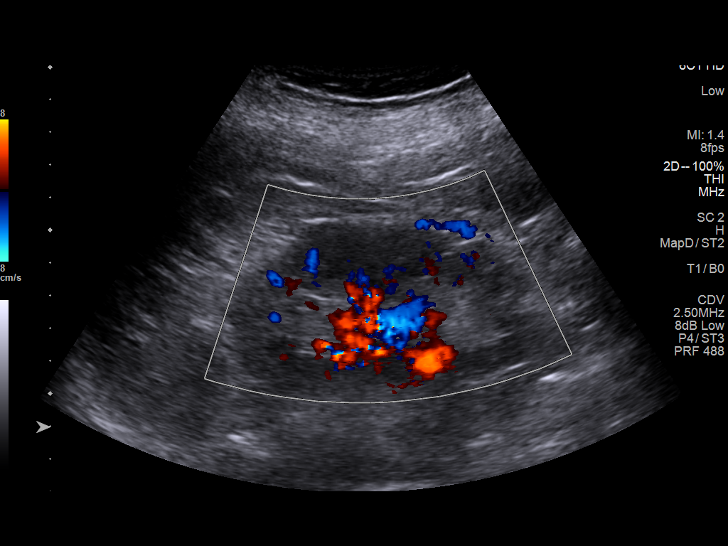
[im 26/41]
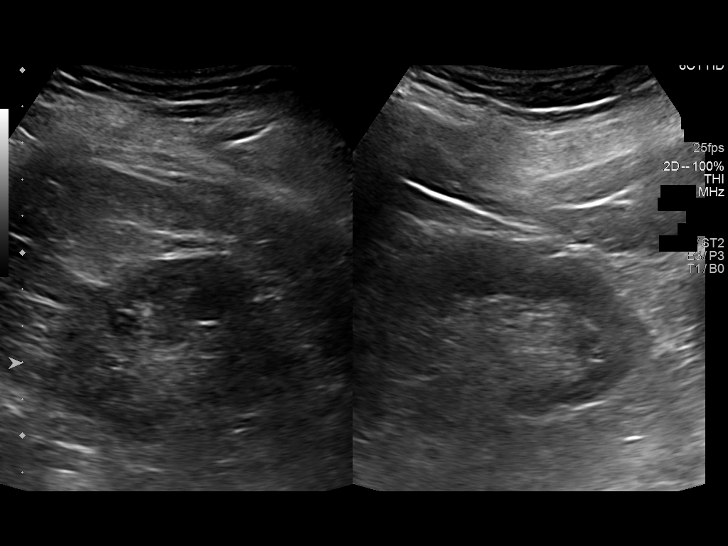
[im 27/41]
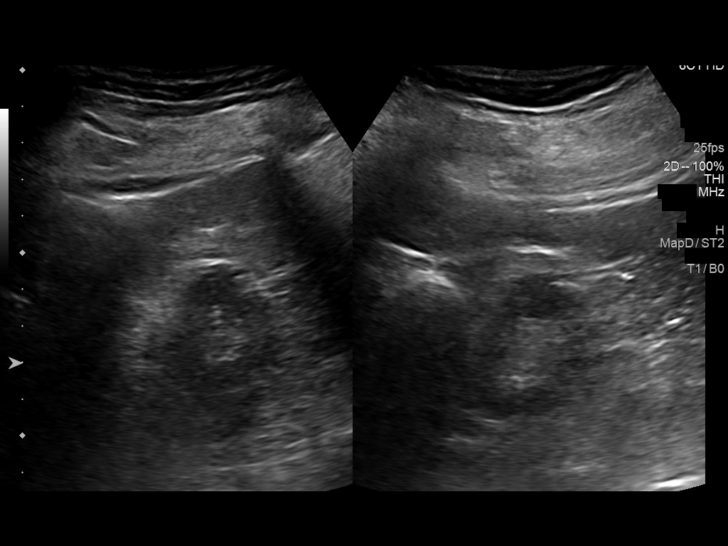
[im 31/41]
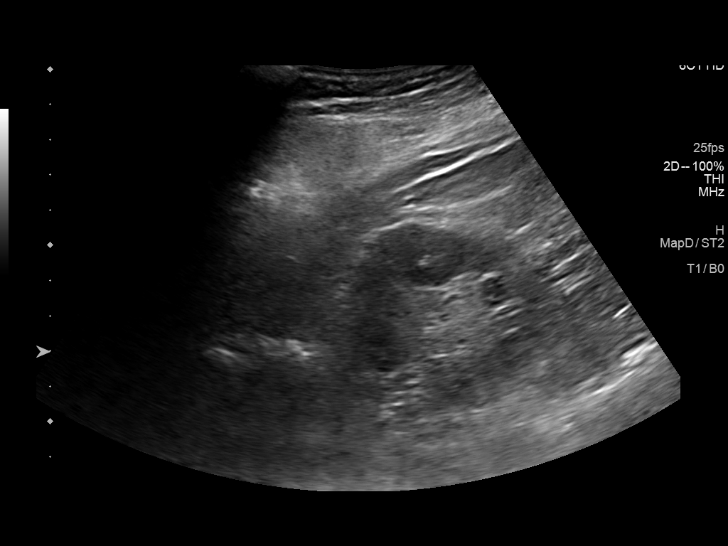
[im 34/41]
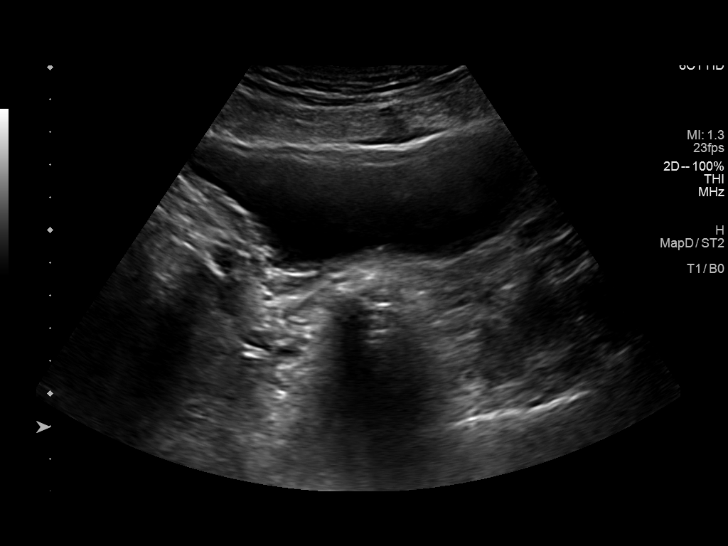
[im 37/41]
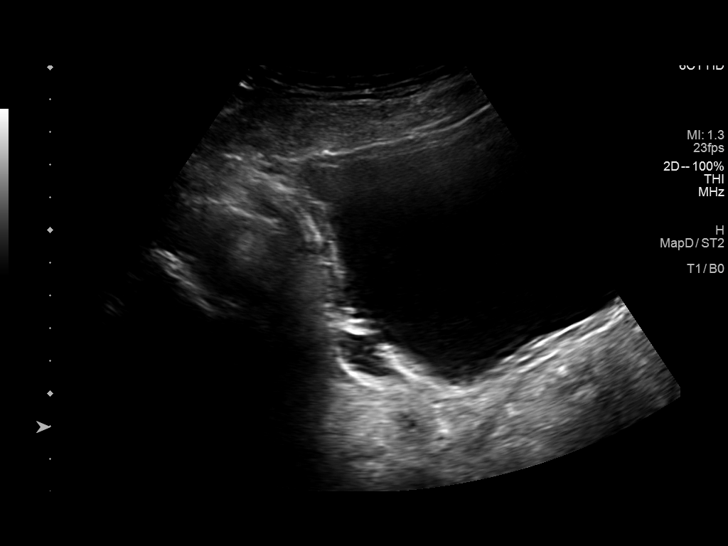
[im 41/41]
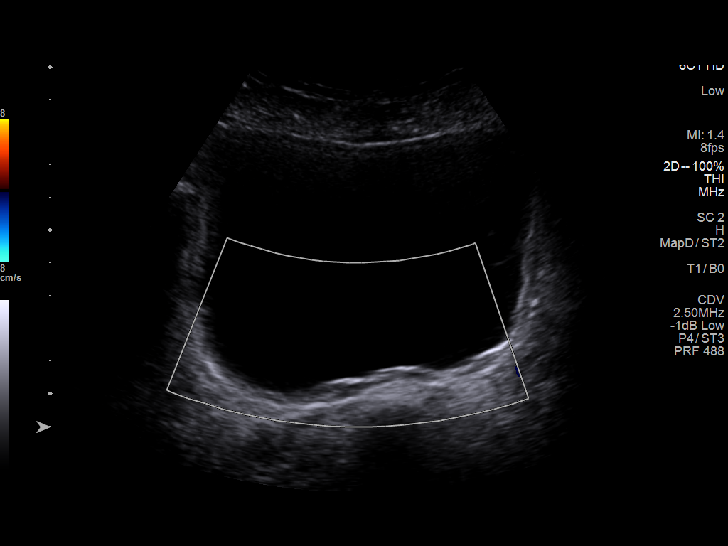

[14 of 25 positions shown; findings below may reference images not displayed]

FINDINGS: Right Kidney:

Renal measurements: 9.4 x 4.1 x 4.7 cm = volume: 95.0 mL. Renal
echogenicity within normal limits. No nephrolithiasis or
hydronephrosis. No focal renal mass.

Left Kidney:

Renal measurements: 9.7 x 4.9 x 4.4 cm = volume: 109.0 mL. Renal
echogenicity within normal limits. No nephrolithiasis or
hydronephrosis. No focal renal mass.

Bladder:

Small diverticulum noted at the right posterior bladder wall.
Bladder otherwise unremarkable.

Other:

None.
IMPRESSION: 1. Normal renal ultrasound. No hydronephrosis or other significant
finding.
2. Small diverticulum at the right posterior bladder wall.

## 2022-09-30 ENCOUNTER — Other Ambulatory Visit: Payer: Self-pay | Admitting: Nurse Practitioner

## 2022-09-30 DIAGNOSIS — R109 Unspecified abdominal pain: Secondary | ICD-10-CM

## 2022-10-19 ENCOUNTER — Ambulatory Visit
Admission: RE | Admit: 2022-10-19 | Discharge: 2022-10-19 | Disposition: A | Discharge status: Home / Self Care | Payer: 59 | Source: Ambulatory Visit | Attending: Nurse Practitioner | Admitting: Nurse Practitioner

## 2022-10-19 DIAGNOSIS — R109 Unspecified abdominal pain: Secondary | ICD-10-CM

## 2022-11-03 ENCOUNTER — Other Ambulatory Visit: Payer: Self-pay | Admitting: Nurse Practitioner

## 2022-11-03 DIAGNOSIS — R911 Solitary pulmonary nodule: Secondary | ICD-10-CM

## 2023-07-19 ENCOUNTER — Other Ambulatory Visit: Payer: Self-pay | Admitting: Family Medicine

## 2023-07-19 DIAGNOSIS — F17200 Nicotine dependence, unspecified, uncomplicated: Secondary | ICD-10-CM

## 2023-07-19 DIAGNOSIS — Z122 Encounter for screening for malignant neoplasm of respiratory organs: Secondary | ICD-10-CM

## 2023-07-26 ENCOUNTER — Other Ambulatory Visit: Payer: Self-pay | Admitting: Family Medicine

## 2023-07-26 DIAGNOSIS — M79604 Pain in right leg: Secondary | ICD-10-CM

## 2023-07-26 DIAGNOSIS — M25551 Pain in right hip: Secondary | ICD-10-CM

## 2023-08-03 ENCOUNTER — Ambulatory Visit
Admission: RE | Admit: 2023-08-03 | Discharge: 2023-08-03 | Disposition: A | Discharge status: Home / Self Care | Source: Ambulatory Visit | Attending: Family Medicine | Admitting: Family Medicine

## 2023-08-03 ENCOUNTER — Inpatient Hospital Stay
Admission: RE | Admit: 2023-08-03 | Discharge: 2023-08-03 | Disposition: A | Discharge status: Home / Self Care | Source: Ambulatory Visit | Attending: Family Medicine

## 2023-08-03 DIAGNOSIS — F17200 Nicotine dependence, unspecified, uncomplicated: Secondary | ICD-10-CM

## 2023-08-03 DIAGNOSIS — M79604 Pain in right leg: Secondary | ICD-10-CM

## 2023-08-03 DIAGNOSIS — Z122 Encounter for screening for malignant neoplasm of respiratory organs: Secondary | ICD-10-CM

## 2023-08-10 ENCOUNTER — Other Ambulatory Visit

## 2023-08-11 ENCOUNTER — Other Ambulatory Visit

## 2023-08-15 ENCOUNTER — Ambulatory Visit
Admission: RE | Admit: 2023-08-15 | Discharge: 2023-08-15 | Disposition: A | Discharge status: Home / Self Care | Source: Ambulatory Visit | Attending: Family Medicine

## 2023-08-15 DIAGNOSIS — M25551 Pain in right hip: Secondary | ICD-10-CM

## 2023-08-25 ENCOUNTER — Other Ambulatory Visit: Payer: Self-pay | Admitting: Vascular Surgery

## 2023-08-25 DIAGNOSIS — M79606 Pain in leg, unspecified: Secondary | ICD-10-CM

## 2023-09-21 ENCOUNTER — Encounter: Payer: Self-pay | Admitting: Vascular Surgery

## 2023-09-21 ENCOUNTER — Ambulatory Visit (HOSPITAL_COMMUNITY)
Admission: RE | Admit: 2023-09-21 | Discharge: 2023-09-21 | Disposition: A | Discharge status: Home / Self Care | Source: Ambulatory Visit | Attending: Vascular Surgery | Admitting: Vascular Surgery

## 2023-09-21 ENCOUNTER — Ambulatory Visit: Attending: Vascular Surgery | Admitting: Vascular Surgery

## 2023-09-21 VITALS — BP 180/80 | HR 53 | Temp 98.0°F | Ht 73.0 in | Wt 189.0 lb

## 2023-09-21 DIAGNOSIS — M79606 Pain in leg, unspecified: Secondary | ICD-10-CM | POA: Diagnosis not present

## 2023-09-21 DIAGNOSIS — I70213 Atherosclerosis of native arteries of extremities with intermittent claudication, bilateral legs: Secondary | ICD-10-CM

## 2023-09-21 LAB — VAS US ABI WITH/WO TBI
Left ABI: 0.64
Right ABI: 0.76

## 2023-09-21 NOTE — Progress Notes (Signed)
 Patient ID: Paul Chase, male   DOB: 01-16-1956, 68 y.o.   MRN: [id number]  Reason for Consult: New Patient (Initial Visit)   Referred by Haze Kingfisher, MD  Subjective:     HPI:  Paul Chase is a 68 y.o. male without significant history of vascular disease.  He does have diabetes, hyperlipidemia and hypertension and continues to smoke approximately 1 pack/day.  He was having leg pain particularly in the right thigh but states that most of this has resolved.  He does have claudication but is able to walk as far as he wants.  He denies any history of stroke, TIA or amaurosis and has no personal or family history of aneurysm disease.  He is without lower extremity rest pain, tissue loss or ulceration.  Past Medical History:  Diagnosis Date   BPH (benign prostatic hyperplasia)    Diabetes mellitus without complication (HCC)    type 2   Foley catheter in place    due to BPH   Glaucoma    Hyperlipidemia    Hypertension    Neuropathy    pt. denies   Stroke (HCC) 2016   minor left side mild weakness   History reviewed. No pertinent family history. Past Surgical History:  Procedure Laterality Date   APPENDECTOMY     EYE SURGERY     r eye cataract   TRANSURETHRAL RESECTION OF PROSTATE N/A 06/17/2017   Procedure: TRANSURETHRAL RESECTION OF THE PROSTATE (TURP) WITH CYSTOSCOPY;  Surgeon: Devere Lonni Righter, MD;  Location: [facility];  Service: Urology;  Laterality: N/A;    Short Social History:  Social History   Tobacco Use   Smoking status: Every Day    Current packs/day: 0.50    Types: Cigarettes   Smokeless tobacco: Never  Substance Use Topics   Alcohol use: Yes    Comment: rare    No Known Allergies  Current Outpatient Medications  Medication Sig Dispense Refill   amLODipine  (NORVASC ) 5 MG tablet Take 1 tablet (5 mg total) by mouth daily. 30 tablet 0   aspirin  EC 81 MG tablet Take 81 mg daily by mouth.     bisoprolol -hydrochlorothiazide  (ZIAC ) 5-6.25 MG  tablet Take 1 tablet daily by mouth.     gabapentin  (NEURONTIN ) 300 MG capsule Take 1 capsule (300 mg total) by mouth 2 (two) times daily. 30 capsule 0   glimepiride  (AMARYL ) 4 MG tablet Take 4 mg daily with breakfast by mouth.     simvastatin  (ZOCOR ) 40 MG tablet Take 40 mg at bedtime by mouth.     tamsulosin  (FLOMAX ) 0.4 MG CAPS capsule Take 1 capsule (0.4 mg total) by mouth daily. (Patient taking differently: Take 0.4 mg by mouth 2 (two) times daily.) 30 capsule 0   travoprost , benzalkonium, (TRAVATAN ) 0.004 % ophthalmic solution Place 1 drop at bedtime into both eyes.     No current facility-administered medications for this visit.    Review of Systems  Constitutional:  Constitutional negative. HENT: HENT negative.  Eyes: Eyes negative.  Cardiovascular: Positive for claudication.  GI: Gastrointestinal negative.  Musculoskeletal: Positive for leg pain.  Neurological: Neurological negative. Hematologic: Hematologic/lymphatic negative.  Psychiatric: Psychiatric negative.        Objective:  Objective   Vitals:   09/21/23 1028  BP: (!) 180/80  Pulse: (!) 53  Temp: 98 F (36.7 C)  SpO2: 95%  Weight: 189 lb (85.7 kg)  Height: 6' 1 (1.854 m)   Body mass index is 24.94 kg/m.  Physical Exam Neck:  Vascular: No carotid bruit.  Cardiovascular:     Rate and Rhythm: Normal rate.     Pulses:          Femoral pulses are 2+ on the right side and 2+ on the left side.      Popliteal pulses are 0 on the right side and 0 on the left side.  Pulmonary:     Effort: Pulmonary effort is normal.  Abdominal:     General: Abdomen is flat.  Musculoskeletal:        General: Normal range of motion.     Right lower leg: No edema.     Left lower leg: No edema.  Skin:    General: Skin is warm.     Capillary Refill: Capillary refill takes 2 to 3 seconds.  Neurological:     General: No focal deficit present.     Mental Status: He is alert.  Psychiatric:        Mood and Affect: Mood  normal.     Data: ABI Findings:  +---------+------------------+-----+----------+--------+  Right   Rt Pressure (mmHg)IndexWaveform  Comment   +---------+------------------+-----+----------+--------+  Brachial 167                                        +---------+------------------+-----+----------+--------+  PTA     134               0.76 monophasic          +---------+------------------+-----+----------+--------+  DP      116               0.66 monophasic          +---------+------------------+-----+----------+--------+  Great Toe95                0.54                     +---------+------------------+-----+----------+--------+   +---------+------------------+-----+----------+-------+  Left    Lt Pressure (mmHg)IndexWaveform  Comment  +---------+------------------+-----+----------+-------+  Brachial 177                                       +---------+------------------+-----+----------+-------+  PTA     114               0.64 monophasic         +---------+------------------+-----+----------+-------+  DP      99                0.56 monophasic         +---------+------------------+-----+----------+-------+  Great Toe65                0.37                    +---------+------------------+-----+----------+-------+   +-------+-----------+-----------+------------+------------+  ABI/TBIToday's ABIToday's TBIPrevious ABIPrevious TBI  +-------+-----------+-----------+------------+------------+  Right 0.76       0.54       0.63        0.36          +-------+-----------+-----------+------------+------------+  Left  0.64       0.37       0.59        0.36          +-------+-----------+-----------+------------+------------+         Summary:  Right: Resting right  ankle-brachial index indicates moderate right lower  extremity arterial disease.  Right toe pressure is >60 mmHg which suggests adequate perfusion  for  healing.  Left: Resting left ankle-brachial index indicates moderate left lower  extremity arterial disease.  Left toe pressure is >60 mmHg which suggests adequate perfusion for  healing.      Assessment/Plan:     68 year old male with history of right thigh pain which has mostly resolved but does have claudication which at this time is nonlife limiting.  We discussed the need for complete cessation of smoking as well as dedicated walking program he demonstrates good understanding.  Will have him follow-up in 1 year with repeat noninvasive studies.    Penne Lonni Colorado MD [facility]

## 2023-10-14 ENCOUNTER — Emergency Department (HOSPITAL_COMMUNITY)
Admission: EM | Admit: 2023-10-14 | Discharge: 2023-10-14 | Disposition: A | Discharge status: Home / Self Care | Attending: Emergency Medicine | Admitting: Emergency Medicine

## 2023-10-14 ENCOUNTER — Emergency Department (HOSPITAL_COMMUNITY)

## 2023-10-14 DIAGNOSIS — R079 Chest pain, unspecified: Secondary | ICD-10-CM

## 2023-10-14 DIAGNOSIS — Z8673 Personal history of transient ischemic attack (TIA), and cerebral infarction without residual deficits: Secondary | ICD-10-CM | POA: Insufficient documentation

## 2023-10-14 DIAGNOSIS — Z7982 Long term (current) use of aspirin: Secondary | ICD-10-CM | POA: Diagnosis not present

## 2023-10-14 DIAGNOSIS — E119 Type 2 diabetes mellitus without complications: Secondary | ICD-10-CM | POA: Diagnosis not present

## 2023-10-14 DIAGNOSIS — R0789 Other chest pain: Secondary | ICD-10-CM | POA: Diagnosis not present

## 2023-10-14 DIAGNOSIS — Z7984 Long term (current) use of oral hypoglycemic drugs: Secondary | ICD-10-CM | POA: Insufficient documentation

## 2023-10-14 DIAGNOSIS — Z79899 Other long term (current) drug therapy: Secondary | ICD-10-CM | POA: Diagnosis not present

## 2023-10-14 DIAGNOSIS — I10 Essential (primary) hypertension: Secondary | ICD-10-CM | POA: Insufficient documentation

## 2023-10-14 LAB — CBC
HCT: 40.6 % (ref 39.0–52.0)
Hemoglobin: 13.3 g/dL (ref 13.0–17.0)
MCH: 30 pg (ref 26.0–34.0)
MCHC: 32.8 g/dL (ref 30.0–36.0)
MCV: 91.4 fL (ref 80.0–100.0)
Platelets: 333 K/uL (ref 150–400)
RBC: 4.44 MIL/uL (ref 4.22–5.81)
RDW: 14.8 % (ref 11.5–15.5)
WBC: 9.3 K/uL (ref 4.0–10.5)
nRBC: 0 % (ref 0.0–0.2)

## 2023-10-14 LAB — URINALYSIS, ROUTINE W REFLEX MICROSCOPIC
Bilirubin Urine: NEGATIVE
Glucose, UA: NEGATIVE mg/dL
Hgb urine dipstick: NEGATIVE
Ketones, ur: NEGATIVE mg/dL
Nitrite: NEGATIVE
Protein, ur: NEGATIVE mg/dL
Specific Gravity, Urine: 1.011 (ref 1.005–1.030)
pH: 5 (ref 5.0–8.0)

## 2023-10-14 LAB — BASIC METABOLIC PANEL WITH GFR
Anion gap: 12 (ref 5–15)
BUN: 9 mg/dL (ref 8–23)
CO2: 24 mmol/L (ref 22–32)
Calcium: 9.5 mg/dL (ref 8.9–10.3)
Chloride: 98 mmol/L (ref 98–111)
Creatinine, Ser: 1.32 mg/dL — ABNORMAL HIGH (ref 0.61–1.24)
GFR, Estimated: 58 mL/min — ABNORMAL LOW (ref >60)
Glucose, Bld: 87 mg/dL (ref 70–99)
Potassium: 3.9 mmol/L (ref 3.5–5.1)
Sodium: 133 mmol/L — ABNORMAL LOW (ref 135–145)

## 2023-10-14 LAB — TROPONIN T, HIGH SENSITIVITY
Troponin T High Sensitivity: <15 ng/L (ref 0–19)
Troponin T High Sensitivity: <15 ng/L (ref 0–19)

## 2023-10-14 LAB — D-DIMER, QUANTITATIVE: D-Dimer, Quant: <0.27 ug{FEU}/mL (ref 0.00–0.50)

## 2023-10-14 MED ORDER — POLYETHYLENE GLYCOL 3350 17 G PO PACK: 17.0000 g | PACK | Freq: Every day | ORAL | 0 refills | Status: AC

## 2023-10-14 MED ORDER — KETOROLAC TROMETHAMINE 30 MG/ML IJ SOLN
30.0000 mg | Freq: Once | INTRAMUSCULAR | Status: AC
  Administered 2023-10-14: 30 mg via INTRAVENOUS
  Filled 2023-10-14: qty 1

## 2023-10-14 MED ORDER — IPRATROPIUM-ALBUTEROL 0.5-2.5 (3) MG/3ML IN SOLN
3.0000 mL | Freq: Once | RESPIRATORY_TRACT | Status: AC
  Administered 2023-10-14: 3 mL via RESPIRATORY_TRACT
  Filled 2023-10-14: qty 3

## 2023-10-14 NOTE — ED Triage Notes (Signed)
 Patient in today reporting right sided chest pain with sob that started 3 days.

## 2023-10-15 NOTE — ED Provider Notes (Signed)
 Paul Chase EMERGENCY DEPARTMENT AT [facility] Provider Note   CSN: [id number] Arrival date & time: 10/14/23  1117     Patient presents with: Chest Pain   Paul Chase is a 68 y.o. male.   Pt is a 68 yo male with pmhx significant for HTN, DM, HLD, CVA, and BPH.  Pt said his wife died about 7 months ago and he's not been taking care of his health very well since then.  Pt has been having right sided cp for the past 3 days.  He has also been constipated.  His wife used to be the one who cooked.  He is not very good at cooking healthy meals for himself.       Prior to Admission medications   Medication Sig Start Date End Date Taking? Authorizing Provider  polyethylene glycol (MIRALAX ) 17 g packet Take 17 g by mouth daily. 10/14/23  Yes Paul Clarity, MD  amLODipine  (NORVASC ) 5 MG tablet Take 1 tablet (5 mg total) by mouth daily. 04/16/17   Lacroce, Samantha J, MD  aspirin  EC 81 MG tablet Take 81 mg daily by mouth.    [provider]  bisoprolol -hydrochlorothiazide  (ZIAC ) 5-6.25 MG tablet Take 1 tablet daily by mouth.    [provider]  gabapentin  (NEURONTIN ) 300 MG capsule Take 1 capsule (300 mg total) by mouth 2 (two) times daily. 03/12/20   Rolinda Rogue, MD  glimepiride  (AMARYL ) 4 MG tablet Take 4 mg daily with breakfast by mouth.    [provider]  simvastatin  (ZOCOR ) 40 MG tablet Take 40 mg at bedtime by mouth.    [provider]  tamsulosin  (FLOMAX ) 0.4 MG CAPS capsule Take 1 capsule (0.4 mg total) by mouth daily. Patient taking differently: Take 0.4 mg by mouth 2 (two) times daily. 04/16/17   Lacroce, Samantha J, MD  travoprost , benzalkonium, (TRAVATAN ) 0.004 % ophthalmic solution Place 1 drop at bedtime into both eyes.    [provider]    Allergies: Patient has no known allergies.    Review of Systems  Cardiovascular:  Positive for chest pain.  All other systems reviewed and are negative.   Updated Vital  Signs BP (!) 148/80   Pulse (!) 58   Temp 97.6 F (36.4 C) (Oral)   Resp 14   Ht 6' (1.829 m)   Wt 86.2 kg   SpO2 100%   BMI 25.77 kg/m   Physical Exam Vitals and nursing note reviewed.  Constitutional:      Appearance: He is well-developed.  HENT:     Head: Normocephalic and atraumatic.  Eyes:     Extraocular Movements: Extraocular movements intact.     Pupils: Pupils are equal, round, and reactive to light.  Cardiovascular:     Rate and Rhythm: Normal rate and regular rhythm.     Heart sounds: Normal heart sounds.  Pulmonary:     Effort: Pulmonary effort is normal.     Breath sounds: Normal breath sounds.  Abdominal:     General: Bowel sounds are normal.     Palpations: Abdomen is soft.  Musculoskeletal:        General: Normal range of motion.     Cervical back: Normal range of motion and neck supple.  Skin:    General: Skin is warm.     Capillary Refill: Capillary refill takes less than 2 seconds.  Neurological:     General: No focal deficit present.     Mental Status: He is alert  and oriented to person, place, and time.  Psychiatric:        Mood and Affect: Mood normal.        Behavior: Behavior normal.     (all labs ordered are listed, but only abnormal results are displayed) Labs Reviewed  BASIC METABOLIC PANEL WITH GFR - Abnormal; Notable for the following components:      Result Value   Sodium 133 (*)    Creatinine, Ser 1.32 (*)    GFR, Estimated 58 (*)    All other components within normal limits  URINALYSIS, ROUTINE W REFLEX MICROSCOPIC - Abnormal; Notable for the following components:   APPearance HAZY (*)    Leukocytes,Ua SMALL (*)    Bacteria, UA RARE (*)    All other components within normal limits  CBC  D-DIMER, QUANTITATIVE  TROPONIN T, HIGH SENSITIVITY  TROPONIN T, HIGH SENSITIVITY    EKG: EKG Interpretation Date/Time:  Friday October 14 2023 14:00:17 EDT Ventricular Rate:  59 PR Interval:  180 QRS Duration:  78 QT  Interval:  401 QTC Calculation: 398 R Axis:   56  Text Interpretation: Sinus rhythm Abnormal R-wave progression, early transition Minimal ST elevation, anterior leads No significant change since last tracing Confirmed by Paul Chase [id number]) on 10/15/2023 4:06:23 PM  Radiology: DG Chest 2 View Result Date: 10/14/2023 EXAM: 2 VIEW(S) XRAY OF THE CHEST 10/14/2023 12:04:00 PM COMPARISON: None available. CLINICAL HISTORY: Chest pain. Pt states chest pain started two days ago and has stayed constant. Pt states he has soreness and It feels like I was in a car accident. FINDINGS: LUNGS AND PLEURA: No focal pulmonary opacity. No pulmonary edema. No pleural effusion. No pneumothorax. HEART AND MEDIASTINUM: No acute abnormality of the cardiac and mediastinal silhouettes. BONES AND SOFT TISSUES: No acute osseous abnormality. Degenerative changes of spine. IMPRESSION: 1. No acute process. Electronically signed by: Donnice Mania MD 10/14/2023 12:38 PM EDT RP Workstation: [id number]     Procedures   Medications Ordered in the ED  ipratropium-albuterol  (DUONEB) 0.5-2.5 (3) MG/3ML nebulizer solution 3 mL (3 mLs Nebulization Given 10/14/23 1401)  ketorolac  (TORADOL ) 30 MG/ML injection 30 mg (30 mg Intravenous Given 10/14/23 1401)                                    Medical Decision Making Amount and/or Complexity of Data Reviewed Labs: ordered. Radiology: ordered.  Risk OTC drugs. Prescription drug management.   This patient presents to the ED for concern of cp, this involves an extensive number of treatment options, and is a complaint that carries with it a high risk of complications and morbidity.  The differential diagnosis includes cardiac, pulm, gi, msk   Co morbidities that complicate the patient evaluation  HTN, DM, HLD, CVA, and BPH   Additional history obtained:  Additional history obtained from epic chart review  Lab Tests:  I Ordered, and personally interpreted labs.  The  pertinent results include:  cbc nl, bmp nl other than mild elevation of cr at 1.32 (stable); ua neg; ddimer neg; trop neg times 2   Imaging Studies ordered:  I ordered imaging studies including cxr  I independently visualized and interpreted imaging which showed No acute process.  I agree with the radiologist interpretation   Cardiac Monitoring:  The patient was maintained on a cardiac monitor.  I personally viewed and interpreted the cardiac monitored which showed an underlying rhythm of: nsr  Medicines ordered and prescription drug management:  I ordered medication including duoneb/toradol   for sx  Reevaluation of the patient after these medicines showed that the patient improved I have reviewed the patients home medicines and have made adjustments as needed   Test Considered:  ct   Problem List / ED Course:  Cp:  atypical.  Cardiac work up neg.  Pt is stable for d/c.  Return if worse.  F/u with pcp.   Reevaluation:  After the interventions noted above, I reevaluated the patient and found that they have :improved   Social Determinants of Health:  Lives at home   Dispostion:  After consideration of the diagnostic results and the patients response to treatment, I feel that the patent would benefit from discharge with outpatient f/u.       Final diagnoses:  Nonspecific chest pain    ED Discharge Orders          Ordered    polyethylene glycol (MIRALAX ) 17 g packet  Daily        10/14/23 1621               Paul Clarity, MD 10/15/23 1610

## 2024-02-15 ENCOUNTER — Ambulatory Visit: Admitting: Podiatry

## 2024-02-24 ENCOUNTER — Ambulatory Visit: Admitting: Podiatry
